# Patient Record
Sex: Male | Born: 1990 | Race: Black or African American | Hispanic: No | Marital: Married | Smoking: Never smoker
Health system: Southern US, Community
[De-identification: ages and names within clinical notes are randomized; demographics above are authoritative.]

## PROBLEM LIST (undated history)

## (undated) DIAGNOSIS — T7840XA Allergy, unspecified, initial encounter: Secondary | ICD-10-CM

## (undated) DIAGNOSIS — J302 Other seasonal allergic rhinitis: Secondary | ICD-10-CM

## (undated) HISTORY — DX: Allergy, unspecified, initial encounter: T78.40XA

---
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
## 2013-04-04 ENCOUNTER — Emergency Department (HOSPITAL_COMMUNITY)
Admission: EM | Admit: 2013-04-04 | Discharge: 2013-04-04 | Disposition: A | Discharge status: Home / Self Care | Payer: BC Managed Care – PPO | Attending: Emergency Medicine | Admitting: Emergency Medicine

## 2013-04-04 ENCOUNTER — Encounter (HOSPITAL_COMMUNITY): Payer: Self-pay | Admitting: Emergency Medicine

## 2013-04-04 DIAGNOSIS — R109 Unspecified abdominal pain: Secondary | ICD-10-CM | POA: Insufficient documentation

## 2013-04-04 DIAGNOSIS — R52 Pain, unspecified: Secondary | ICD-10-CM | POA: Insufficient documentation

## 2013-04-04 DIAGNOSIS — IMO0001 Reserved for inherently not codable concepts without codable children: Secondary | ICD-10-CM | POA: Insufficient documentation

## 2013-04-04 DIAGNOSIS — R112 Nausea with vomiting, unspecified: Secondary | ICD-10-CM | POA: Insufficient documentation

## 2013-04-04 DIAGNOSIS — R51 Headache: Secondary | ICD-10-CM | POA: Insufficient documentation

## 2013-04-04 DIAGNOSIS — R509 Fever, unspecified: Secondary | ICD-10-CM | POA: Insufficient documentation

## 2013-04-04 DIAGNOSIS — B349 Viral infection, unspecified: Secondary | ICD-10-CM

## 2013-04-04 DIAGNOSIS — K2970 Gastritis, unspecified, without bleeding: Secondary | ICD-10-CM | POA: Insufficient documentation

## 2013-04-04 DIAGNOSIS — K2990 Gastroduodenitis, unspecified, without bleeding: Secondary | ICD-10-CM | POA: Insufficient documentation

## 2013-04-04 DIAGNOSIS — B9789 Other viral agents as the cause of diseases classified elsewhere: Secondary | ICD-10-CM | POA: Insufficient documentation

## 2013-04-04 LAB — CBC WITH DIFFERENTIAL/PLATELET
Basophils Absolute: 0 10*3/uL (ref 0.0–0.1)
Basophils Relative: 0 % (ref 0–1)
Eosinophils Absolute: 0 10*3/uL (ref 0.0–0.7)
Eosinophils Relative: 0 % (ref 0–5)
HCT: 43.2 % (ref 39.0–52.0)
Hemoglobin: 16 g/dL (ref 13.0–17.0)
Lymphocytes Relative: 6 % — ABNORMAL LOW (ref 12–46)
Lymphs Abs: 0.4 10*3/uL — ABNORMAL LOW (ref 0.7–4.0)
MCH: 31.5 pg (ref 26.0–34.0)
MCHC: 37 g/dL — ABNORMAL HIGH (ref 30.0–36.0)
MCV: 85 fL (ref 78.0–100.0)
Monocytes Absolute: 0.6 10*3/uL (ref 0.1–1.0)
Monocytes Relative: 9 % (ref 3–12)
Neutro Abs: 5.7 10*3/uL (ref 1.7–7.7)
Neutrophils Relative %: 85 % — ABNORMAL HIGH (ref 43–77)
Platelets: 217 10*3/uL (ref 150–400)
RBC: 5.08 MIL/uL (ref 4.22–5.81)
RDW: 12.2 % (ref 11.5–15.5)
WBC: 6.8 10*3/uL (ref 4.0–10.5)

## 2013-04-04 LAB — COMPREHENSIVE METABOLIC PANEL
ALT: 28 U/L (ref 0–53)
Albumin: 3.8 g/dL (ref 3.5–5.2)
Calcium: 9.3 mg/dL (ref 8.4–10.5)
GFR calc Af Amer: >90 mL/min (ref >90)
Glucose, Bld: 108 mg/dL — ABNORMAL HIGH (ref 70–99)
Sodium: 137 mEq/L (ref 135–145)
Total Protein: 7.5 g/dL (ref 6.0–8.3)

## 2013-04-04 MED ORDER — ONDANSETRON HCL 4 MG/2ML IJ SOLN
4.0000 mg | Freq: Once | INTRAMUSCULAR | Status: AC
  Administered 2013-04-04: 4 mg via INTRAVENOUS
  Filled 2013-04-04: qty 2

## 2013-04-04 MED ORDER — SODIUM CHLORIDE 0.9 % IV BOLUS (SEPSIS)
500.0000 mL | Freq: Once | INTRAVENOUS | Status: AC
  Administered 2013-04-04: 500 mL via INTRAVENOUS

## 2013-04-04 MED ORDER — PROMETHAZINE HCL 25 MG PO TABS: 25.0000 mg | ORAL_TABLET | Freq: Three times a day (TID) | ORAL | Status: DC | PRN

## 2013-04-04 NOTE — ED Notes (Addendum)
Patient states he has been N/V and fevers since yesterday. Patient has not taken any OTC's. Patient ambulated to the room without difficulty but stated he has been dizzy. He states he thinks he is dehydrated. Patient states his whole body aches,"feels like its muscle and joint pain".

## 2013-04-04 NOTE — ED Provider Notes (Signed)
History     CSN: [id number]  Arrival date & time 04/04/13  1125   First MD Initiated Contact with Patient 04/04/13 1212      Chief Complaint  Patient presents with  . Emesis    (Consider location/radiation/quality/duration/timing/severity/associated sxs/prior treatment) HPI Comments: Joel Bradley is a 22 y/o M presenting to the ED with abdominal pain, emesis, nausea, and generalized bodyaches x 1 day. Patient reported that the symptoms started yesterday after Church. Patient stated that he vomited 3 times - stated that it was mainly of food contents first that gradually went to liquid contents - denied blood, mucus, biliary substances. Stated that he has not had an episode of emesis today. Patient reported that he has been having intermittent abdominal pain since yesterday - described discomfort as a burning sensation that occurs only when he feels hungry and last only a short couple of minutes. Patient reported that he is having generalized bodyaches - described as an aching sensation to both his "muscles and bones" stated that he has been having some mild muscle cramping in his calves that last for a couple of seconds. Patient reported that he took Tylenol cold and flu yesterday one time - stated that it did not help. Patient reported that he was fine Saturday. Associated symptoms are subjective fevers and mild headache. Denied diarrhea, hematochezia, melena, chest pain, shortness of breathe, difficulty breathing, abdominal pain, neck stiffness, neck pain, ear complaints, visual distortions, eye complaints.  PCP: Dr. Tenny Craw    The history is provided by the patient. No language interpreter was used.    History reviewed. No pertinent past medical history.  History reviewed. No pertinent past surgical history.  No family history on file.  History  Substance Use Topics  . Smoking status: Never Smoker   . Smokeless tobacco: Not on file  . Alcohol Use: Yes      Review of Systems   Constitutional: Positive for fever. Negative for chills, diaphoresis and fatigue.  HENT: Negative for ear pain, congestion, sore throat, trouble swallowing, neck pain, neck stiffness and tinnitus.   Eyes: Negative for photophobia, pain and visual disturbance.  Respiratory: Negative for cough, chest tightness, shortness of breath and wheezing.   Cardiovascular: Negative for chest pain.  Gastrointestinal: Positive for nausea, vomiting and abdominal pain. Negative for diarrhea, constipation and blood in stool.  Genitourinary: Negative for dysuria, urgency, frequency, hematuria, decreased urine volume, penile swelling, difficulty urinating, penile pain and testicular pain.  Musculoskeletal: Positive for myalgias and arthralgias.  Skin: Negative for rash and wound.  Neurological: Positive for headaches. Negative for dizziness, weakness, light-headedness and numbness.  All other systems reviewed and are negative.    Allergies  Review of patient's allergies indicates no known allergies.  Home Medications   Current Outpatient Rx  Name  Route  Sig  Dispense  Refill  . Phenylephrine-DM-GG-APAP (TYLENOL COLD/FLU SEVERE PO)   Oral   Take 1 tablet by mouth every 6 (six) hours as needed (for cold symptoms).         . promethazine (PHENERGAN) 25 MG tablet   Oral   Take 1 tablet (25 mg total) by mouth every 8 (eight) hours as needed for nausea.   12 tablet   0     BP 119/70  Pulse 71  Temp(Src) 98.8 F (37.1 C) (Oral)  Resp 21  SpO2 98%  Physical Exam  Nursing note and vitals reviewed. Constitutional: He is oriented to person, place, and time. He appears well-developed and well-nourished.  No distress.  HENT:  Head: Normocephalic and atraumatic.  Mouth/Throat: Oropharynx is clear and moist and mucous membranes are normal. No oropharyngeal exudate, posterior oropharyngeal edema, posterior oropharyngeal erythema or tonsillar abscesses.  Uvula midline, symmetrical elevation  Eyes:  Conjunctivae and EOM are normal. Pupils are equal, round, and reactive to light. Right eye exhibits no discharge. Left eye exhibits no discharge.  Neck: Normal range of motion. Neck supple. No tracheal deviation present.  Negative nuchal rigidity Negative neck stiffness Negative lymphadenopathy Negative tenderness along cervical spine - negative spinous process tenderness.   Cardiovascular: Normal rate, regular rhythm and normal heart sounds.  Exam reveals no friction rub.   No murmur heard. Radial pulses 2+ bilaterally Pedal pulses 2+ bilaterally  Pulmonary/Chest: Effort normal and breath sounds normal. No respiratory distress. He has no wheezes. He has no rales.  Abdominal: Soft. Bowel sounds are normal. He exhibits no distension and no mass. There is no tenderness. There is no rebound and no guarding.  Musculoskeletal: Normal range of motion. He exhibits no edema and no tenderness.  Full ROM to upper and lower extremities Strength 5+/5+ to upper and lower extremities  Negative swelling, erythema, effusion, warmth to touch, inflammation to upper and lower joints - negative sign of infection  Lymphadenopathy:    He has no cervical adenopathy.  Neurological: He is alert and oriented to person, place, and time. No cranial nerve deficit. He exhibits normal muscle tone. Coordination normal.  Cranial nerves III-XII grossly intact   Skin: Skin is warm and dry. No rash noted. He is not diaphoretic. No erythema.  Psychiatric: He has a normal mood and affect. His behavior is normal. Thought content normal.    ED Course  Procedures (including critical care time)  IV NS fluids with Zofran IV given  2:34PM Patient re-evaluated, feeling better.   Labs Reviewed  CBC WITH DIFFERENTIAL - Abnormal; Notable for the following:    MCHC 37.0 (*)    Neutrophils Relative % 85 (*)    Lymphocytes Relative 6 (*)    Lymphs Abs 0.4 (*)    All other components within normal limits  COMPREHENSIVE METABOLIC  PANEL - Abnormal; Notable for the following:    Glucose, Bld 108 (*)    Total Bilirubin 2.7 (*)    All other components within normal limits  LIPASE, BLOOD   No results found.   1. Gastritis   2. Viral illness       MDM  Patient afebrile, non-tachycardic, non-tachypneic, normotensive, adequate saturation on room air.  Negative fever, chills, neck stiffness, nuchal rigidity, neurological deficit, tenderness along cervical spine - less likely meningitis.  CBC and CMP negative findings - negative elevation of WBC Lipase negative - ruling out possible pancreatitis Discussed case with Dr. Denton Lank - suspicious of viral infection. Patient aseptic, non-toxic appearing, in no acute distress. Patient feeling better after fluids and antiemetics. Discharged patient with suspected viral infection and gastritis. Discharge patient with small dose of anti-emetics - discussed course. Referred patient to be followed up with PCP by the end of this week. Discussed with patient to stay hydrated, rest, Ibuprofen as when needed. Discussed viral symptoms and course. Discussed with patient to continue to monitor symptoms and if symptoms are to worsen or change to report back to the ED. Patient agreed to plan of care, understood, all questions answered.           Raymon Mutton, PA-C 04/04/13 2116

## 2013-04-04 NOTE — ED Notes (Signed)
States has been vomiting x 3 days had a fever yesterday and body aching no diarrhea

## 2013-04-05 NOTE — ED Provider Notes (Signed)
Medical screening examination/treatment/procedure(s) were conducted as a shared visit with non-physician practitioner(s) and myself.  I personally evaluated the patient during the encounter Pt with nv in past 1-2 days. Nausea now improved w med/ivf. abd soft nt. No fever.   Suzi Roots, MD 04/05/13 [phone]

## 2014-02-17 ENCOUNTER — Emergency Department (HOSPITAL_COMMUNITY)
Admission: EM | Admit: 2014-02-17 | Discharge: 2014-02-17 | Disposition: A | Discharge status: Home / Self Care | Payer: PRIVATE HEALTH INSURANCE | Attending: Emergency Medicine | Admitting: Emergency Medicine

## 2014-02-17 ENCOUNTER — Encounter (HOSPITAL_COMMUNITY): Payer: Self-pay | Admitting: Emergency Medicine

## 2014-02-17 DIAGNOSIS — J302 Other seasonal allergic rhinitis: Secondary | ICD-10-CM

## 2014-02-17 DIAGNOSIS — J309 Allergic rhinitis, unspecified: Secondary | ICD-10-CM | POA: Insufficient documentation

## 2014-02-17 HISTORY — DX: Other seasonal allergic rhinitis: J30.2

## 2014-02-17 MED ORDER — CETIRIZINE HCL 10 MG PO TABS: 10.0000 mg | ORAL_TABLET | Freq: Every day | ORAL | Status: DC

## 2014-02-17 MED ORDER — OXYMETAZOLINE HCL 0.05 % NA SOLN
1.0000 | Freq: Once | NASAL | Status: AC
  Administered 2014-02-17: 1 via NASAL
  Filled 2014-02-17: qty 15

## 2014-02-17 MED ORDER — FLUTICASONE PROPIONATE 50 MCG/ACT NA SUSP: 1.0000 | Freq: Every day | NASAL | Status: DC

## 2014-02-17 NOTE — ED Provider Notes (Signed)
CSN: [id number]     Arrival date & time 02/17/14  0407 History   First MD Initiated Contact with Patient 02/17/14 [phone]     Chief Complaint  Patient presents with  . Allergies     (Consider location/radiation/quality/duration/timing/severity/associated sxs/prior Treatment) HPI Comments: 23 year old male with history of seasonal allergies who presents with one day of runny nose, significant nasal congestion, itchy eyes and chest congestion. He states this happens every year in the spring, nothing makes it better or worse, took one dose of Benadryl earlier in the evening with minimal improvement. He denies fevers, stiff neck, chest pain or shortness of breath  The history is provided by the patient.    Past Medical History  Diagnosis Date  . Seasonal allergies    History reviewed. No pertinent past surgical history. No family history on file. History  Substance Use Topics  . Smoking status: Never Smoker   . Smokeless tobacco: Not on file  . Alcohol Use: Yes    Review of Systems  All other systems reviewed and are negative.      Allergies  Review of patient's allergies indicates no known allergies.  Home Medications   Current Outpatient Rx  Name  Route  Sig  Dispense  Refill  . cetirizine (ZYRTEC ALLERGY) 10 MG tablet   Oral   Take 1 tablet (10 mg total) by mouth daily.   30 tablet   1   . fluticasone (FLONASE) 50 MCG/ACT nasal spray   Each Nare   Place 1 spray into both nostrils daily.   9.9 g   2     Dispense small bottle - 9.889ml    BP 126/72  Pulse 56  Temp(Src) 97.4 F (36.3 C) (Oral)  Resp 19  SpO2 97% Physical Exam  Nursing note and vitals reviewed. Constitutional: He appears well-developed and well-nourished. No distress.  HENT:  Head: Normocephalic and atraumatic.  Mouth/Throat: Oropharynx is clear and moist. No oropharyngeal exudate.  Significant bilateral turbinate swelling in the naris, voluminous amounts of clear discharge, tympanic  membranes clear bilaterally  Eyes: Conjunctivae and EOM are normal. Pupils are equal, round, and reactive to light. Right eye exhibits no discharge. Left eye exhibits no discharge. No scleral icterus.  Neck: Normal range of motion. Neck supple. No JVD present. No thyromegaly present.  Cardiovascular: Normal rate, regular rhythm, normal heart sounds and intact distal pulses.  Exam reveals no gallop and no friction rub.   No murmur heard. Pulmonary/Chest: Effort normal and breath sounds normal. No respiratory distress. He has no wheezes. He has no rales.  Lymphadenopathy:    He has no cervical adenopathy.  Neurological: He is alert. Coordination normal.  Skin: Skin is warm and dry. No rash noted. No erythema.  Psychiatric: He has a normal mood and affect. His behavior is normal.    ED Course  Procedures (including critical care time) Labs Review Labs Reviewed - No data to display Imaging Review No results found.   EKG Interpretation None      MDM   Final diagnoses:  Seasonal allergies    Well-appearing, signs and symptoms consistent with seasonal allergies, stable for discharge with the following medications   Meds given in ED:  Medications  oxymetazoline (AFRIN) 0.05 % nasal spray 1 spray (not administered)    New Prescriptions   CETIRIZINE (ZYRTEC ALLERGY) 10 MG TABLET    Take 1 tablet (10 mg total) by mouth daily.   FLUTICASONE (FLONASE) 50 MCG/ACT NASAL SPRAY    Place  1 spray into both nostrils daily.        Vida Roller, MD 02/17/14 [phone]

## 2014-02-17 NOTE — Discharge Instructions (Signed)
Please call your doctor for a followup appointment within 24-48 hours. When you talk to your doctor please let them know that you were seen in the emergency department and have them acquire all of your records so that they can discuss the findings with you and formulate a treatment plan to fully care for your new and ongoing problems. ° °

## 2014-02-17 NOTE — ED Notes (Signed)
Patient expressing sinus congestion, sneezing, eyes itching, and sinus pressure. Requests medication for allergies.

## 2014-02-17 NOTE — ED Notes (Signed)
Pt. reports runny nose , nasal congestion , coryza with occasional productive cough onset last week , pt. stated history seasonal allergies worse this year .

## 2014-03-02 ENCOUNTER — Emergency Department (HOSPITAL_COMMUNITY)
Admission: EM | Admit: 2014-03-02 | Discharge: 2014-03-02 | Disposition: A | Discharge status: Home / Self Care | Payer: PRIVATE HEALTH INSURANCE | Attending: Emergency Medicine | Admitting: Emergency Medicine

## 2014-03-02 ENCOUNTER — Encounter (HOSPITAL_COMMUNITY): Payer: Self-pay | Admitting: Emergency Medicine

## 2014-03-02 DIAGNOSIS — J309 Allergic rhinitis, unspecified: Secondary | ICD-10-CM | POA: Insufficient documentation

## 2014-03-02 DIAGNOSIS — R062 Wheezing: Secondary | ICD-10-CM | POA: Insufficient documentation

## 2014-03-02 DIAGNOSIS — H579 Unspecified disorder of eye and adnexa: Secondary | ICD-10-CM | POA: Insufficient documentation

## 2014-03-02 DIAGNOSIS — J302 Other seasonal allergic rhinitis: Secondary | ICD-10-CM

## 2014-03-02 DIAGNOSIS — J3489 Other specified disorders of nose and nasal sinuses: Secondary | ICD-10-CM | POA: Insufficient documentation

## 2014-03-02 MED ORDER — ALBUTEROL SULFATE HFA 108 (90 BASE) MCG/ACT IN AERS: 2.0000 | INHALATION_SPRAY | RESPIRATORY_TRACT | Status: DC | PRN

## 2014-03-02 MED ORDER — PREDNISONE 20 MG PO TABS
60.0000 mg | ORAL_TABLET | Freq: Once | ORAL | Status: AC
  Administered 2014-03-02: 60 mg via ORAL
  Filled 2014-03-02: qty 3

## 2014-03-02 MED ORDER — ALBUTEROL SULFATE (2.5 MG/3ML) 0.083% IN NEBU
5.0000 mg | INHALATION_SOLUTION | Freq: Once | RESPIRATORY_TRACT | Status: AC
  Administered 2014-03-02: 5 mg via RESPIRATORY_TRACT
  Filled 2014-03-02: qty 6

## 2014-03-02 MED ORDER — IPRATROPIUM BROMIDE 0.02 % IN SOLN
0.5000 mg | Freq: Once | RESPIRATORY_TRACT | Status: AC
  Administered 2014-03-02: 0.5 mg via RESPIRATORY_TRACT
  Filled 2014-03-02: qty 2.5

## 2014-03-02 MED ORDER — PREDNISONE 10 MG PO TABS: ORAL_TABLET | ORAL | Status: DC

## 2014-03-02 NOTE — Discharge Instructions (Signed)
Stop taking any nasal spray which contains oxymetazoline. Continue zyrtec and nasacort. Take prednisone as prescribed until all gone with next dose due tomorrow. Take inhaler 2 puffs every 4 hrs as needed. Try saline nasal spray for congestion. Follow up with primary care doctor.    Allergies Allergies may happen from anything your body is sensitive to. This may be food, medicines, pollens, chemicals, and nearly anything around you in everyday life that produces allergens. An allergen is anything that causes an allergy producing substance. Heredity is often a factor in causing these problems. This means you may have some of the same allergies as your parents. Food allergies happen in all age groups. Food allergies are some of the most severe and life threatening. Some common food allergies are cow's milk, seafood, eggs, nuts, wheat, and soybeans. SYMPTOMS   Swelling around the mouth.  An itchy red rash or hives.  Vomiting or diarrhea.  Difficulty breathing. SEVERE ALLERGIC REACTIONS ARE LIFE-THREATENING. This reaction is called anaphylaxis. It can cause the mouth and throat to swell and cause difficulty with breathing and swallowing. In severe reactions only a trace amount of food (for example, peanut oil in a salad) may cause death within seconds. Seasonal allergies occur in all age groups. These are seasonal because they usually occur during the same season every year. They may be a reaction to molds, grass pollens, or tree pollens. Other causes of problems are house dust mite allergens, pet dander, and mold spores. The symptoms often consist of nasal congestion, a runny itchy nose associated with sneezing, and tearing itchy eyes. There is often an associated itching of the mouth and ears. The problems happen when you come in contact with pollens and other allergens. Allergens are the particles in the air that the body reacts to with an allergic reaction. This causes you to release allergic  antibodies. Through a chain of events, these eventually cause you to release histamine into the blood stream. Although it is meant to be protective to the body, it is this release that causes your discomfort. This is why you were given anti-histamines to feel better. If you are unable to pinpoint the offending allergen, it may be determined by skin or blood testing. Allergies cannot be cured but can be controlled with medicine. Hay fever is a collection of all or some of the seasonal allergy problems. It may often be treated with simple over-the-counter medicine such as diphenhydramine. Take medicine as directed. Do not drink alcohol or drive while taking this medicine. Check with your caregiver or package insert for child dosages. If these medicines are not effective, there are many new medicines your caregiver can prescribe. Stronger medicine such as nasal spray, eye drops, and corticosteroids may be used if the first things you try do not work well. Other treatments such as immunotherapy or desensitizing injections can be used if all else fails. Follow up with your caregiver if problems continue. These seasonal allergies are usually not life threatening. They are generally more of a nuisance that can often be handled using medicine. HOME CARE INSTRUCTIONS   If unsure what causes a reaction, keep a diary of foods eaten and symptoms that follow. Avoid foods that cause reactions.  If hives or rash are present:  Take medicine as directed.  You may use an over-the-counter antihistamine (diphenhydramine) for hives and itching as needed.  Apply cold compresses (cloths) to the skin or take baths in cool water. Avoid hot baths or showers. Heat will make a  rash and itching worse.  If you are severely allergic:  Following a treatment for a severe reaction, hospitalization is often required for closer follow-up.  Wear a medic-alert bracelet or necklace stating the allergy.  You and your family must  learn how to give adrenaline or use an anaphylaxis kit.  If you have had a severe reaction, always carry your anaphylaxis kit or EpiPen with you. Use this medicine as directed by your caregiver if a severe reaction is occurring. Failure to do so could have a fatal outcome. SEEK MEDICAL CARE IF:  You suspect a food allergy. Symptoms generally happen within 30 minutes of eating a food.  Your symptoms have not gone away within 2 days or are getting worse.  You develop new symptoms.  You want to retest yourself or your child with a food or drink you think causes an allergic reaction. Never do this if an anaphylactic reaction to that food or drink has happened before. Only do this under the care of a caregiver. SEEK IMMEDIATE MEDICAL CARE IF:   You have difficulty breathing, are wheezing, or have a tight feeling in your chest or throat.  You have a swollen mouth, or you have hives, swelling, or itching all over your body.  You have had a severe reaction that has responded to your anaphylaxis kit or an EpiPen. These reactions may return when the medicine has worn off. These reactions should be considered life threatening. MAKE SURE YOU:   Understand these instructions.  Will watch your condition.  Will get help right away if you are not doing well or get worse. Document Released: 01/27/2003 Document Revised: 02/28/2013 Document Reviewed: 07/03/2008 [facility] Patient Information 2014 [place]. Bronchospasm, Adult A bronchospasm is when the tubes that carry air in and out of your lungs (airwarys) spasm or tighten. During a bronchospasm it is hard to breathe. This is because the airways get smaller. A bronchospasm can be triggered by:  Allergies. These may be to animals, pollen, food, or mold.  Infection. This is a common cause of bronchospasm.  Exercise.  Irritants. These include pollution, cigarette smoke, strong odors, aerosol sprays, and paint fumes.  Weather  changes.  Stress.  Being emotional. HOME CARE   Always have a plan for getting help. Know when to call your doctor and local emergency services (911 in the [place] Know where you can get emergency care.  Only take medicines as told by your doctor.  If you were prescribed an inhaler or nebulizer machine, ask your doctor how to use it correctly. Always use a spacer with your inhaler if you were given one.  Stay calm during an attack. Try to relax and breathe more slowly.  Control your home environment:  Change your heating and air conditioning filter at least once a month.  Limit your use of fireplaces and wood stoves.  Do not  smoke. Do not  allow smoking in your home.  Avoid perfumes and fragrances.  Get rid of pests (such as roaches and mice) and their droppings.  Throw away plants if you see mold on them.  Keep your house clean and dust free.  Replace carpet with wood, tile, or vinyl flooring. Carpet can trap dander and dust.  Use allergy-proof pillows, mattress covers, and box spring covers.  Wash bed sheets and blankets every week in hot water. Dry them in a dryer.  Use blankets that are made of polyester or cotton.  Wash hands frequently. GET HELP IF:  You have  muscle aches.  You have chest pain.  The thick spit you spit or cough up (sputum) changes from clear or white to yellow, green, gray, or bloody.  The thick spit you spit or cough up gets thicker.  There are problems that may be related to the medicine you are given such as:  A rash.  Itching.  Swelling.  Trouble breathing. GET HELP RIGHT AWAY IF:  You feel you cannot breathe or catch your breath.  You cannot stop coughing.  Your treatment is not helping you breathe better. MAKE SURE YOU:   Understand these instructions.  Will watch your condition.  Will get help right away if you are not doing well or get worse. Document Released: 08/31/2009 Document Revised: 07/06/2013 Document  Reviewed: 04/26/2013 [facility] Patient Information 2014 [place].

## 2014-03-02 NOTE — ED Notes (Signed)
He states hes had congestion and allergies for the past few weeks. He was seen here recently and given allergy medication for the symptoms which did not help.

## 2014-03-02 NOTE — ED Provider Notes (Signed)
CSN: [id number]     Arrival date & time 03/02/14  1633 History   First MD Initiated Contact with Patient 03/02/14 1711    This chart was scribed for non-physician practitioner, Jaynie Crumbleatyana Tiffny Gemmer, PA, working with Raeford RazorStephen Kohut, MD by Marica OtterNusrat Rahman, ED Scribe. This patient was seen in room TR08C/TR08C and the patient's care was started at 5:32 PM.  PCP: Miguel AschoffOSS,ALLAN, MD Chief Complaint  Patient presents with  . Allergies   The history is provided by the patient. No language interpreter was used.   HPI Comments: [name] is a 23 y.o. male, with a history of seasonal allergies, who presents to the Emergency Department complaining of allergies with associated cough, rhinorrhea, itchy eyes, difficulty breathing, wheezing and congestion onset a few weeks ago. Pt reports his intermittent breathing difficulties began earlier this week. Pt reports he has been using flonase and zyrtec without relief.    Past Medical History  Diagnosis Date  . Seasonal allergies    History reviewed. No pertinent past surgical history. History reviewed. No pertinent family history. History  Substance Use Topics  . Smoking status: Never Smoker   . Smokeless tobacco: Not on file  . Alcohol Use: Yes    Review of Systems  Constitutional: Negative for fever and chills.  HENT: Positive for congestion and rhinorrhea.   Eyes: Positive for itching.  Respiratory: Positive for cough and wheezing.       Allergies  Review of patient's allergies indicates no known allergies.  Home Medications   Prior to Admission medications   Medication Sig Start Date End Date Taking? Authorizing Provider  cetirizine (ZYRTEC ALLERGY) 10 MG tablet Take 1 tablet (10 mg total) by mouth daily. 02/17/14   Vida RollerBrian D Miller, MD  fluticasone (FLONASE) 50 MCG/ACT nasal spray Place 1 spray into both nostrils daily. 02/17/14   Vida RollerBrian D Miller, MD   Triage Vitals: BP 132/66  Pulse 64  Temp(Src) 98.6 F (37 C) (Oral)  Resp 16  Ht 6\' 4"   (1.93 m)  Wt 227 lb 9.6 oz (103.239 kg)  BMI 27.72 kg/m2  SpO2 100% Physical Exam  Nursing note and vitals reviewed. Constitutional: He is oriented to person, place, and time. He appears well-developed and well-nourished. No distress.  HENT:  Head: Normocephalic and atraumatic.  Right Ear: External ear normal.  Left Ear: External ear normal.  Mouth/Throat: Oropharynx is clear and moist. No oropharyngeal exudate.  Eyes: EOM are normal.  Neck: Neck supple. No tracheal deviation present.  Cardiovascular: Normal rate.   Pulmonary/Chest: Effort normal. No respiratory distress. He has wheezes (expiratory ). He has no rales. He exhibits no tenderness.  Musculoskeletal: Normal range of motion.  Neurological: He is alert and oriented to person, place, and time.  Skin: Skin is warm and dry.  Psychiatric: He has a normal mood and affect. His behavior is normal.    ED Course  Procedures (including critical care time) DIAGNOSTIC STUDIES: Oxygen Saturation is 100% on RA, normal by my interpretation.    COORDINATION OF CARE:  5:34 PM-Discussed treatment plan which includes continuing flonase and zyrtec, breathing treatment, and prescription for inhaler with pt at bedside and pt agreed to plan.   Labs Review Labs Reviewed - No data to display  Imaging Review No results found.   EKG Interpretation None      MDM   Final diagnoses:  Wheezing  Seasonal allergies    Patient with seasonal allergies, already taking Zyrtec, Nasacort, presents with diffuse expiratory wheezes and shortness  of breath. His vital signs are normal. Have given him a breathing treatment at 60 mg for prednisone for his wheezing which has helped his symptoms. He does have history of reactive airway disease, with wheezing in the past. Suspect this might have been triggered by seasonal allergies. I have advised him to continue to take his allergy medications, and will give him a short five-day course of prednisone, and  an inhaler. Followup with primary care doctor.   Filed Vitals:   03/02/14 1700  BP: 132/66  Pulse: 64  Temp: 98.6 F (37 C)  TempSrc: Oral  Resp: 16  Height: 6\' 4"  (1.93 m)  Weight: 227 lb 9.6 oz (103.239 kg)  SpO2: 100%    I personally performed the services described in this documentation, which was scribed in my presence. The recorded information has been reviewed and is accurate.      Lottie Musselatyana A Lynnann Knudsen, PA-C 03/03/14 [phone]

## 2014-03-06 NOTE — ED Provider Notes (Signed)
Medical screening examination/treatment/procedure(s) were performed by non-physician practitioner and as supervising physician I was immediately available for consultation/collaboration.   EKG Interpretation None       Suanne Minahan, MD 03/06/14 0919 

## 2014-09-04 ENCOUNTER — Emergency Department (HOSPITAL_COMMUNITY)
Admission: EM | Admit: 2014-09-04 | Discharge: 2014-09-04 | Disposition: A | Discharge status: Home / Self Care | Payer: PRIVATE HEALTH INSURANCE | Attending: Emergency Medicine | Admitting: Emergency Medicine

## 2014-09-04 ENCOUNTER — Encounter (HOSPITAL_COMMUNITY): Payer: Self-pay | Admitting: Emergency Medicine

## 2014-09-04 DIAGNOSIS — R51 Headache: Secondary | ICD-10-CM | POA: Insufficient documentation

## 2014-09-04 DIAGNOSIS — Y9389 Activity, other specified: Secondary | ICD-10-CM | POA: Diagnosis not present

## 2014-09-04 DIAGNOSIS — Z7951 Long term (current) use of inhaled steroids: Secondary | ICD-10-CM | POA: Diagnosis not present

## 2014-09-04 DIAGNOSIS — Z043 Encounter for examination and observation following other accident: Secondary | ICD-10-CM | POA: Insufficient documentation

## 2014-09-04 DIAGNOSIS — Y92410 Unspecified street and highway as the place of occurrence of the external cause: Secondary | ICD-10-CM | POA: Diagnosis not present

## 2014-09-04 DIAGNOSIS — H6121 Impacted cerumen, right ear: Secondary | ICD-10-CM | POA: Insufficient documentation

## 2014-09-04 DIAGNOSIS — H6122 Impacted cerumen, left ear: Secondary | ICD-10-CM | POA: Insufficient documentation

## 2014-09-04 DIAGNOSIS — Z8709 Personal history of other diseases of the respiratory system: Secondary | ICD-10-CM | POA: Diagnosis not present

## 2014-09-04 DIAGNOSIS — M2550 Pain in unspecified joint: Secondary | ICD-10-CM | POA: Diagnosis not present

## 2014-09-04 DIAGNOSIS — Z79899 Other long term (current) drug therapy: Secondary | ICD-10-CM | POA: Diagnosis not present

## 2014-09-04 MED ORDER — METHOCARBAMOL 500 MG PO TABS: 500.0000 mg | ORAL_TABLET | Freq: Two times a day (BID) | ORAL | Status: DC

## 2014-09-04 MED ORDER — IBUPROFEN 800 MG PO TABS: 800.0000 mg | ORAL_TABLET | Freq: Three times a day (TID) | ORAL | Status: DC
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
## 2014-09-04 NOTE — Discharge Instructions (Signed)

## 2014-09-05 NOTE — ED Provider Notes (Signed)
Medical screening examination/treatment/procedure(s) were performed by non-physician practitioner and as supervising physician I was immediately available for consultation/collaboration.   EKG Interpretation None        Samuel JesterKathleen Eilah Common, DO 09/05/14 2315

## 2015-06-04 ENCOUNTER — Encounter (HOSPITAL_COMMUNITY): Payer: Self-pay | Admitting: Emergency Medicine

## 2015-06-04 ENCOUNTER — Emergency Department (HOSPITAL_COMMUNITY)
Admission: EM | Admit: 2015-06-04 | Discharge: 2015-06-04 | Disposition: A | Discharge status: Home / Self Care | Payer: PRIVATE HEALTH INSURANCE | Attending: Emergency Medicine | Admitting: Emergency Medicine

## 2015-06-04 DIAGNOSIS — X58XXXA Exposure to other specified factors, initial encounter: Secondary | ICD-10-CM | POA: Insufficient documentation

## 2015-06-04 DIAGNOSIS — M545 Low back pain, unspecified: Secondary | ICD-10-CM

## 2015-06-04 DIAGNOSIS — Y9367 Activity, basketball: Secondary | ICD-10-CM | POA: Insufficient documentation

## 2015-06-04 DIAGNOSIS — M62830 Muscle spasm of back: Secondary | ICD-10-CM | POA: Insufficient documentation

## 2015-06-04 DIAGNOSIS — Z8709 Personal history of other diseases of the respiratory system: Secondary | ICD-10-CM | POA: Insufficient documentation

## 2015-06-04 DIAGNOSIS — S3992XA Unspecified injury of lower back, initial encounter: Secondary | ICD-10-CM | POA: Insufficient documentation

## 2015-06-04 DIAGNOSIS — Y929 Unspecified place or not applicable: Secondary | ICD-10-CM | POA: Insufficient documentation

## 2015-06-04 DIAGNOSIS — Z79899 Other long term (current) drug therapy: Secondary | ICD-10-CM | POA: Insufficient documentation

## 2015-06-04 DIAGNOSIS — Z791 Long term (current) use of non-steroidal anti-inflammatories (NSAID): Secondary | ICD-10-CM | POA: Insufficient documentation

## 2015-06-04 DIAGNOSIS — Y999 Unspecified external cause status: Secondary | ICD-10-CM | POA: Insufficient documentation

## 2015-06-04 DIAGNOSIS — R001 Bradycardia, unspecified: Secondary | ICD-10-CM | POA: Insufficient documentation

## 2015-06-04 MED ORDER — NAPROXEN 500 MG PO TABS: 500.0000 mg | ORAL_TABLET | Freq: Two times a day (BID) | ORAL | Status: DC | PRN

## 2015-06-04 MED ORDER — CYCLOBENZAPRINE HCL 10 MG PO TABS: 10.0000 mg | ORAL_TABLET | Freq: Three times a day (TID) | ORAL | Status: DC | PRN

## 2015-06-04 MED ORDER — NAPROXEN 500 MG PO TABS
500.0000 mg | ORAL_TABLET | Freq: Once | ORAL | Status: AC
Start: 2015-06-04 — End: 2015-06-04
  Administered 2015-06-04: 500 mg via ORAL
  Filled 2015-06-04: qty 1

## 2015-06-04 MED ORDER — CYCLOBENZAPRINE HCL 10 MG PO TABS
10.0000 mg | ORAL_TABLET | Freq: Once | ORAL | Status: AC
  Administered 2015-06-04: 10 mg via ORAL
  Filled 2015-06-04: qty 1

## 2015-06-04 NOTE — ED Notes (Signed)
Pt c/o lower back pain that started on Saturday when playing basketball. Pt state the pain is constant.

## 2015-06-04 NOTE — Discharge Instructions (Signed)
Back Pain:  Your back pain should be treated with medicines such as ibuprofen or aleve and this back pain should get better over the next 2 weeks.  However if you develop severe or worsening pain, low back pain with fever, numbness, weakness or inability to walk or urinate, you should return to the ER immediately.  Please follow up with your doctor this week for a recheck if still having symptoms. Avoid heavy lifting over 10 pounds for the next two weeks.  Low back pain is discomfort in the lower back that may be due to injuries to muscles and ligaments around the spine.  Occasionally, it may be caused by a a problem to a part of the spine called a disc.  The pain may last several days or a week;  However, most patients get completely well in 4 weeks.  Self - care:  The application of heat can help soothe the pain.  Maintaining your daily activities, including walking, is encourged, as it will help you get better faster than just staying in bed. Perform gentle stretching as discussed. Drink plenty of fluids.  Medications are also useful to help with pain control.  A commonly prescribed medication includes tylenol.  Non steroidal anti inflammatory medications including Ibuprofen and naproxen;  These medications help both pain and swelling and are very useful in treating back pain.  They should be taken with food, as they can cause stomach upset, and more seriously, stomach bleeding.    Muscle relaxants:  These medications can help with muscle tightness that is a cause of lower back pain.  Most of these medications can cause drowsiness, and it is not safe to drive or use dangerous machinery while taking them.  SEEK IMMEDIATE MEDICAL ATTENTION IF: New numbness, tingling, weakness, or problem with the use of your arms or legs.  Severe back pain not relieved with medications.  Difficulty with or loss of control of your bowel or bladder control.  Increasing pain in any areas of the body (such as chest or  abdominal pain).  Shortness of breath, dizziness or fainting.  Nausea (feeling sick to your stomach), vomiting, fever, or sweats.  You will need to follow up with  Your primary healthcare provider in 1-2 weeks for reassessment.   Back Pain, Adult Back pain is very common. The pain often gets better over time. The cause of back pain is usually not dangerous. Most people can learn to manage their back pain on their own.  HOME CARE   Stay active. Start with short walks on flat ground if you can. Try to walk farther each day.  Do not sit, drive, or stand in one place for more than 30 minutes. Do not stay in bed.  Do not avoid exercise or work. Activity can help your back heal faster.  Be careful when you bend or lift an object. Bend at your knees, keep the object close to you, and do not twist.  Sleep on a firm mattress. Lie on your side, and bend your knees. If you lie on your back, put a pillow under your knees.  Only take medicines as told by your doctor.  Put ice on the injured area.  Put ice in a plastic bag.  Place a towel between your skin and the bag.  Leave the ice on for 15-20 minutes, 03-04 times a day for the first 2 to 3 days. After that, you can switch between ice and heat packs.  Ask your doctor about  back exercises or massage.  Avoid feeling anxious or stressed. Find good ways to deal with stress, such as exercise. GET HELP RIGHT AWAY IF:   Your pain does not go away with rest or medicine.  Your pain does not go away in 1 week.  You have new problems.  You do not feel well.  The pain spreads into your legs.  You cannot control when you poop (bowel movement) or pee (urinate).  Your arms or legs feel weak or lose feeling (numbness).  You feel sick to your stomach (nauseous) or throw up (vomit).  You have belly (abdominal) pain.  You feel like you may pass out (faint). MAKE SURE YOU:   Understand these instructions.  Will watch your  condition.  Will get help right away if you are not doing well or get worse. Document Released: 04/21/2008 Document Revised: 01/26/2012 Document Reviewed: 03/07/2014 [facility] Patient Information 2015 [place], [place]. This information is not intended to replace advice given to you by your health care provider. Make sure you discuss any questions you have with your health care provider.  Back Injury Prevention The following tips can help you to prevent a back injury. PHYSICAL FITNESS  Exercise often. Try to develop strong stomach (abdominal) muscles.  Do aerobic exercises often. This includes walking, jogging, biking, swimming.  Do exercises that help with balance and strength often. This includes tai chi and yoga.  Stretch before and after you exercise.  Keep a healthy weight. DIET   Ask your doctor how much calcium and vitamin D you need every day.  Include calcium in your diet. Foods high in calcium include dairy products; green, leafy vegetables; and products with calcium added (fortified).  Include vitamin D in your diet. Foods high in vitamin D include milk and products with vitamin D added.  Think about taking a multivitamin or other nutritional products called " supplements."  Stop smoking if you smoke. POSTURE   Sit and stand up straight. Avoid leaning forward or hunching over.  Choose chairs that support your lower back.  If you work at a desk:  Sit close to your work so you do not lean over.  Keep your chin tucked in.  Keep your neck drawn back.  Keep your elbows bent at a right angle. Your arms should look like the letter "L."  Sit high and close to the steering wheel when you drive. Add low back support to your car seat if needed.  Avoid sitting or standing in one position for too long. Get up and move around every hour. Take breaks if you are driving for a long time.  Sleep on your side with your knees slightly bent. You can also sleep on your back with a  pillow under your knees. Do not sleep on your stomach. LIFTING, TWISTING, AND REACHING  Avoid heavy lifting, especially lifting over and over again. If you must do heavy lifting:  Stretch before lifting.  Work slowly.  Rest between lifts.  Use carts and dollies to move objects when possible.  Make several small trips instead of carrying 1 heavy load.  Ask for help when you need it.  Ask for help when moving big, awkward objects.  Follow these steps when lifting:  Stand with your feet shoulder-width apart.  Get as close to the object as you can. Do not pick up heavy objects that are far from your body.  Use handles or lifting straps when possible.  Bend at your knees. Squat down,  but keep your heels off the floor.  Keep your shoulders back, your chin tucked in, and your back straight.  Lift the object slowly. Tighten the muscles in your legs, stomach, and butt. Keep the object as close to the center of your body as possible.  Reverse these directions when you put a load down.  Do not:  Lift the object above your waist.  Twist at the waist while lifting or carrying a load. Move your feet if you need to turn, not your waist.  Bend over without bending at your knees.  Avoid reaching over your head, across a table, or for an object on a high surface. OTHER TIPS  Avoid wet floors and keep sidewalks clear of ice.  Do not sleep on a mattress that is too soft or too hard.  Keep items that you use often within easy reach.  Put heavier objects on shelves at waist level. Put lighter objects on lower or higher shelves.  Find ways to lessen your stress. You can try exercise, massage, or relaxation.  Get help for depression or anxiety if needed. GET HELP IF:  You injure your back.  You have questions about diet, exercise, or other ways to prevent back injuries. MAKE SURE YOU:  Understand these instructions.  Will watch your condition.  Will get help right away if  you are not doing well or get worse. Document Released: 04/21/2008 Document Revised: 01/26/2012 Document Reviewed: 12/15/2011 [facility] Patient Information 2015 [place], [place]. This information is not intended to replace advice given to you by your health care provider. Make sure you discuss any questions you have with your health care provider.  Back Exercises Back exercises help treat and prevent back injuries. The goal is to increase your strength in your belly (abdominal) and back muscles. These exercises can also help with flexibility. Start these exercises when told by your doctor. HOME CARE Back exercises include: Pelvic Tilt.  Lie on your back with your knees bent. Tilt your pelvis until the lower part of your back is against the floor. Hold this position 5 to 10 sec. Repeat this exercise 5 to 10 times. Knee to Chest.  Pull 1 knee up against your chest and hold for 20 to 30 seconds. Repeat this with the other knee. This may be done with the other leg straight or bent, whichever feels better. Then, pull both knees up against your chest. Sit-Ups or Curl-Ups.  Bend your knees 90 degrees. Start with tilting your pelvis, and do a partial, slow sit-up. Only lift your upper half 30 to 45 degrees off the floor. Take at least 2 to 3 seonds for each sit-up. Do not do sit-ups with your knees out straight. If partial sit-ups are difficult, simply do the above but with only tightening your belly (abdominal) muscles and holding it as told. Hip-Lift.  Lie on your back with your knees flexed 90 degrees. Push down with your feet and shoulders as you raise your hips 2 inches off the floor. Hold for 10 seconds, repeat 5 to 10 times. Back Arches.  Lie on your stomach. Prop yourself up on bent elbows. Slowly press on your hands, causing an arch in your low back. Repeat 3 to 5 times. Shoulder-Lifts.  Lie face down with arms beside your body. Keep hips and belly pressed to floor as you slowly lift your  head and shoulders off the floor. Do not overdo your exercises. Be careful in the beginning. Exercises may cause you some mild  back discomfort. If the pain lasts for more than 15 minutes, stop the exercises until you see your doctor. Improvement with exercise for back problems is slow.  Document Released: 12/06/2010 Document Revised: 01/26/2012 Document Reviewed: 09/04/2011 [facility] Patient Information 2015 [place], [place]. This information is not intended to replace advice given to you by your health care provider. Make sure you discuss any questions you have with your health care provider.

## 2015-06-04 NOTE — ED Provider Notes (Signed)
CSN: [id number]     Arrival date & time 06/04/15  1236 History   First MD Initiated Contact with Patient 06/04/15 1255     Chief Complaint  Patient presents with  . Back Pain     (Consider location/radiation/quality/duration/timing/severity/associated sxs/prior Treatment) HPI Comments: Joel Bradley is a 24 y.o. male with a PMHx of seasonal allergies, who presents to the ED with complaints of back pain that began 2 days ago when he went up for a layup while playing basketball and when he came down on both legs, he felt the back pain begin. He reports the pain is 8/10 constant low back soreness achy pain radiating down both thighs, worse with movement and bending, and unrelieved with NSAIDs and his sisters Norco. He denies any fevers, chills, chest pain, shortness breath, abdominal pain, nausea, vomiting, diarrhea, speech, melena, hematochezia, dysuria, hematuria, flank pain, numbness, tingling, weakness, cauda equina symptoms, headache, vision changes, history of cancer or IV drug use.  Patient is a 24 y.o. male presenting with back pain. The history is provided by the patient. No language interpreter was used.  Back Pain Location:  Lumbar spine Quality:  Aching Radiates to:  L thigh and R thigh Pain severity:  Moderate Pain is:  Same all the time Onset quality:  Gradual Duration:  2 days Timing:  Constant Progression:  Unchanged Chronicity:  New Context: recent injury   Context comment:  Playing basketball, came down on his legs and felt back pain Relieved by:  Nothing Worsened by:  Movement and bending Ineffective treatments:  NSAIDs and narcotics Associated symptoms: no abdominal pain, no bladder incontinence, no chest pain, no dysuria, no fever, no headaches, no numbness, no paresthesias, no perianal numbness, no tingling and no weakness   Risk factors: no hx of cancer     Past Medical History  Diagnosis Date  . Seasonal allergies    History reviewed. No pertinent past  surgical history. No family history on file. History  Substance Use Topics  . Smoking status: Never Smoker   . Smokeless tobacco: Not on file  . Alcohol Use: Yes     Comment: occasional     Review of Systems  Constitutional: Negative for fever and chills.  Eyes: Negative for visual disturbance.  Respiratory: Negative for shortness of breath.   Cardiovascular: Negative for chest pain.  Gastrointestinal: Negative for nausea, vomiting, abdominal pain, diarrhea, constipation and blood in stool.  Genitourinary: Negative for bladder incontinence, dysuria, hematuria and flank pain.  Musculoskeletal: Positive for back pain. Negative for myalgias and arthralgias.  Skin: Negative for color change.  Allergic/Immunologic: Negative for immunocompromised state.  Neurological: Negative for tingling, weakness, numbness, headaches and paresthesias.  Psychiatric/Behavioral: Negative for confusion.   10 Systems reviewed and are negative for acute change except as noted in the HPI.    Allergies  Review of patient's allergies indicates no known allergies.  Home Medications   Prior to Admission medications   Medication Sig Start Date End Date Taking? Authorizing Provider  albuterol (PROVENTIL HFA;VENTOLIN HFA) 108 (90 BASE) MCG/ACT inhaler Inhale 2 puffs into the lungs every 4 (four) hours as needed for wheezing or shortness of breath. 03/02/14   Tatyana Kirichenko, PA-C  cetirizine (ZYRTEC ALLERGY) 10 MG tablet Take 1 tablet (10 mg total) by mouth daily. 02/17/14   Eber HongBrian Miller, MD  ibuprofen (ADVIL,MOTRIN) 800 MG tablet Take 1 tablet (800 mg total) by mouth 3 (three) times daily. 09/04/14   Fayrene HelperBowie Tran, PA-C  methocarbamol (ROBAXIN) 500 MG  tablet Take 1 tablet (500 mg total) by mouth 2 (two) times daily. 09/04/14   Fayrene Helper, PA-C  predniSONE (DELTASONE) 10 MG tablet Take 5 tab day 1, take 4 tab day 2, take 3 tab day 3, take 2 tab day 4, and take 1 tab day 5 03/02/14   Tatyana Kirichenko, PA-C    triamcinolone (NASACORT ALLERGY 24HR) 55 MCG/ACT AERO nasal inhaler Place 2 sprays into the nose daily.    Historical Provider, MD   BP 123/68 mmHg  Pulse 57  Temp(Src) 98.1 F (36.7 C) (Oral)  Resp 16  Ht 6' 4.5" (1.943 m)  Wt 230 lb (104.327 kg)  BMI 27.63 kg/m2  SpO2 98% Physical Exam  Constitutional: He is oriented to person, place, and time. Vital signs are normal. He appears well-developed and well-nourished.  Non-toxic appearance. No distress.  Afebrile, nontoxic, NAD  HENT:  Head: Normocephalic and atraumatic.  Mouth/Throat: Oropharynx is clear and moist and mucous membranes are normal.  Eyes: Conjunctivae and EOM are normal. Right eye exhibits no discharge. Left eye exhibits no discharge.  Neck: Normal range of motion. Neck supple.  Cardiovascular: Regular rhythm, normal heart sounds and intact distal pulses.  Bradycardia present.  Exam reveals no gallop and no friction rub.   No murmur heard. HR 57 on triage vitals, reg rhythm, nl s1/s2, nl m/r/g. Distal pulses intact  Pulmonary/Chest: Effort normal and breath sounds normal. No respiratory distress. He has no decreased breath sounds. He has no wheezes. He has no rhonchi. He has no rales.  Abdominal: Soft. Normal appearance and bowel sounds are normal. He exhibits no distension. There is no tenderness. There is no rigidity, no rebound, no guarding and no CVA tenderness.  Musculoskeletal: Normal range of motion.       Lumbar back: He exhibits tenderness and spasm. He exhibits normal range of motion, no bony tenderness and no deformity.       Back:  Lumbar spine with FROM intact without spinous process TTP, no bony stepoffs or deformities, mild b/l paraspinous muscle TTP and palpable muscle spasms. Strength 5/5 in all extremities, sensation grossly intact in all extremities, negative SLR bilaterally, gait steady and nonantalgic. No overlying skin changes.   Neurological: He is alert and oriented to person, place, and time. He  has normal strength. No sensory deficit.  Skin: Skin is warm, dry and intact. No rash noted.  Psychiatric: He has a normal mood and affect.  Nursing note and vitals reviewed.   ED Course  Procedures (including critical care time) Labs Review Labs Reviewed - No data to display  Imaging Review No results found.   EKG Interpretation None      MDM   Final diagnoses:  Bilateral low back pain without sciatica  Muscle spasm of back    24 y.o. male here with low back pain since Saturday when he played basketball and came down on his legs. No red flag s/s of low back pain. No s/s of central cord compression or cauda equina. Lower extremities are neurovascularly intact and patient is ambulating without difficulty. Mild b/l paraspinous muscle tenderness and spasm, no midline tenderness. Doubt need for imaging.  Patient was counseled on back pain precautions and told to do activity as tolerated but do not lift, push, or pull heavy objects more than 10 pounds for the next week. Patient counseled to use ice or heat on back for no longer than 15 minutes every hour.   Rx given for muscle relaxer and counseled  on proper use of muscle relaxant medication. Rx for naprosyn given. Discussed use of tylenol as needed PRN pain. Urged patient not to drink alcohol, drive, or perform any other activities that requires focus while taking muscle relaxants.   Patient urged to follow-up with PCP if pain does not improve with treatment and rest or if pain becomes recurrent. Urged to return with worsening severe pain, loss of bowel or bladder control, trouble walking. The patient verbalizes understanding and agrees with the plan.   BP 123/68 mmHg  Pulse 57  Temp(Src) 98.1 F (36.7 C) (Oral)  Resp 16  Ht 6' 4.5" (1.943 m)  Wt 230 lb (104.327 kg)  BMI 27.63 kg/m2  SpO2 98%  Meds ordered this encounter  Medications  . cyclobenzaprine (FLEXERIL) tablet 10 mg    Sig:   . naproxen (NAPROSYN) tablet 500 mg     Sig:   . cyclobenzaprine (FLEXERIL) 10 MG tablet    Sig: Take 1 tablet (10 mg total) by mouth 3 (three) times daily as needed for muscle spasms.    Dispense:  15 tablet    Refill:  0    Order Specific Question:  Supervising Provider    Answer:  MILLER, BRIAN [[id number]]  . naproxen (NAPROSYN) 500 MG tablet    Sig: Take 1 tablet (500 mg total) by mouth 2 (two) times daily as needed for mild pain, moderate pain or headache (TAKE WITH MEALS.).    Dispense:  20 tablet    Refill:  0    Order Specific Question:  Supervising Provider    Answer:  Eber Hong [[id number]]     Kanden Carey Camprubi-Soms, PA-C 06/04/15 1333  Toy Cookey, MD 06/04/15 1556

## 2017-02-20 ENCOUNTER — Encounter (HOSPITAL_COMMUNITY): Payer: Self-pay

## 2017-02-20 DIAGNOSIS — J301 Allergic rhinitis due to pollen: Secondary | ICD-10-CM | POA: Insufficient documentation

## 2017-02-20 NOTE — ED Triage Notes (Signed)
Pt reports increased nasal/chest congestionseasonal and watery eyes r/t worsening seasonal allergies. Pt A+OX4, speaking in complete sentences, ambulatory to triage.

## 2017-02-21 ENCOUNTER — Emergency Department (HOSPITAL_COMMUNITY)
Admission: EM | Admit: 2017-02-21 | Discharge: 2017-02-21 | Disposition: A | Discharge status: Home / Self Care | Payer: PRIVATE HEALTH INSURANCE | Attending: Emergency Medicine | Admitting: Emergency Medicine

## 2017-02-21 DIAGNOSIS — J301 Allergic rhinitis due to pollen: Secondary | ICD-10-CM

## 2017-02-21 MED ORDER — PREDNISONE 10 MG (21) PO TBPK: ORAL_TABLET | Freq: Every day | ORAL | 0 refills | Status: DC

## 2017-02-21 NOTE — ED Provider Notes (Signed)
[facility]-EMERGENCY DEPT Provider Note   CSN: [id number] Arrival date & time: 02/20/17  2326  By signing my name below, I, Elder Negus, attest that this documentation has been prepared under the direction and in the presence of Lorre Nick, MD. Electronically Signed: Elder Negus, Scribe. 02/21/17. 1:30 AM.   History   Chief Complaint Chief Complaint  Patient presents with  . URI    HPI Joel Bradley is a 26 y.o. male with history of seasonal allergies who presents to the ED with upper respiratory complaints. He states that in the last several days he has experienced chest congestion, watering eyes, rhinorrhea, and sneezing. He states these are his typical symptoms when experiencing allergies. He is taking "liquid over the counter therapy" at home which is not helping.  The history is provided by the patient. No language interpreter was used.  URI   This is a recurrent problem. Episode onset: several days. The problem has not changed since onset.There has been no fever. Associated symptoms include congestion, rhinorrhea and sneezing. Treatments tried: over-the-counter therapy. The treatment provided no relief.    Past Medical History:  Diagnosis Date  . Seasonal allergies     There are no active problems to display for this patient.   History reviewed. No pertinent surgical history.     Home Medications    Prior to Admission medications   Medication Sig Start Date End Date Taking? Authorizing Provider  albuterol (PROVENTIL HFA;VENTOLIN HFA) 108 (90 BASE) MCG/ACT inhaler Inhale 2 puffs into the lungs every 4 (four) hours as needed for wheezing or shortness of breath. Patient not taking: Reported on 06/04/2015 03/02/14   Jaynie Crumble, PA-C  cetirizine (ZYRTEC ALLERGY) 10 MG tablet Take 1 tablet (10 mg total) by mouth daily. Patient not taking: Reported on 06/04/2015 02/17/14   Eber Hong, MD  cyclobenzaprine (FLEXERIL) 10 MG tablet Take 1 tablet (10 mg total) by  mouth 3 (three) times daily as needed for muscle spasms. 06/04/15   Mercedes Street, PA-C  ibuprofen (ADVIL,MOTRIN) 800 MG tablet Take 1 tablet (800 mg total) by mouth 3 (three) times daily. Patient not taking: Reported on 06/04/2015 09/04/14   Fayrene Helper, PA-C  methocarbamol (ROBAXIN) 500 MG tablet Take 1 tablet (500 mg total) by mouth 2 (two) times daily. 09/04/14   Fayrene Helper, PA-C  naproxen (NAPROSYN) 500 MG tablet Take 1 tablet (500 mg total) by mouth 2 (two) times daily as needed for mild pain, moderate pain or headache (TAKE WITH MEALS.). 06/04/15   Mercedes Street, PA-C  naproxen sodium (ANAPROX) 220 MG tablet Take 220 mg by mouth 2 (two) times daily with a meal.    Historical Provider, MD  OVER THE COUNTER MEDICATION Apply 1 application topically once. Pain Relief Cream    Historical Provider, MD  predniSONE (DELTASONE) 10 MG tablet Take 5 tab day 1, take 4 tab day 2, take 3 tab day 3, take 2 tab day 4, and take 1 tab day 5 Patient not taking: Reported on 06/04/2015 03/02/14   Jaynie Crumble, PA-C    Family History History reviewed. No pertinent family history.  Social History Social History  Substance Use Topics  . Smoking status: Never Smoker  . Smokeless tobacco: Never Used  . Alcohol use Yes     Comment: occasional      Allergies   Patient has no known allergies.   Review of Systems Review of Systems  Constitutional: Negative for fever.  HENT: Positive for congestion, rhinorrhea and sneezing.  All other systems reviewed and are negative.    Physical Exam Updated Vital Signs BP 129/86 (BP Location: Left Arm)   Pulse 66   Temp 97.7 F (36.5 C) (Oral)   Resp 18   Ht  (1.93 m)   Wt 251 lb (113.9 kg)   SpO2 99%   BMI 30.55 kg/m   Physical Exam  Constitutional: He is oriented to person, place, and time. He appears well-developed and well-nourished.  Non-toxic appearance. No distress.  HENT:  Head: Normocephalic and atraumatic.  No maxillary tenderness.    Eyes: Conjunctivae, EOM and lids are normal. Pupils are equal, round, and reactive to light.  Neck: Normal range of motion. Neck supple. No tracheal deviation present. No thyroid mass present.  Cardiovascular: Normal rate, regular rhythm and normal heart sounds.  Exam reveals no gallop.   No murmur heard. Pulmonary/Chest: Effort normal and breath sounds normal. No stridor. No respiratory distress. He has no decreased breath sounds. He has no wheezes. He has no rhonchi. He has no rales.  Abdominal: Soft. Normal appearance and bowel sounds are normal. He exhibits no distension. There is no tenderness. There is no rebound and no CVA tenderness.  Musculoskeletal: Normal range of motion. He exhibits no edema or tenderness.  Neurological: He is alert and oriented to person, place, and time. He has normal strength. No cranial nerve deficit or sensory deficit. GCS eye subscore is 4. GCS verbal subscore is 5. GCS motor subscore is 6.  Skin: Skin is warm and dry. No abrasion and no rash noted.  Psychiatric: He has a normal mood and affect. His speech is normal and behavior is normal.  Nursing note and vitals reviewed.    ED Treatments / Results  Labs (all labs ordered are listed, but only abnormal results are displayed) Labs Reviewed - No data to display  EKG  EKG Interpretation None       Radiology No results found.  Procedures Procedures (including critical care time)  Medications Ordered in ED Medications - No data to display   Initial Impression / Assessment and Plan / ED Course  I have reviewed the triage vital signs and the nursing notes.  Pertinent labs & imaging results that were available during my care of the patient were reviewed by me and considered in my medical decision making (see chart for details).     Patient to be treated with prednisone taper. And will give referral to allergist  Final Clinical Impressions(s) / ED Diagnoses   Final diagnoses:  None     New Prescriptions New Prescriptions   No medications on file  I personally performed the services described in this documentation, which was scribed in my presence. The recorded information has been reviewed and is accurate.      Lorre Nick, MD 02/21/17 [phone]

## 2017-11-25 ENCOUNTER — Encounter: Payer: Self-pay | Admitting: Physician Assistant

## 2017-11-25 ENCOUNTER — Other Ambulatory Visit: Payer: Self-pay

## 2017-11-25 ENCOUNTER — Ambulatory Visit: Payer: BLUE CROSS/BLUE SHIELD | Admitting: Physician Assistant

## 2017-11-25 VITALS — BP 132/80 | HR 66 | Temp 98.1°F | Resp 16 | Ht 73.5 in | Wt 268.6 lb

## 2017-11-25 DIAGNOSIS — N489 Disorder of penis, unspecified: Secondary | ICD-10-CM | POA: Diagnosis not present

## 2017-11-25 DIAGNOSIS — R509 Fever, unspecified: Secondary | ICD-10-CM

## 2017-11-25 DIAGNOSIS — R42 Dizziness and giddiness: Secondary | ICD-10-CM | POA: Diagnosis not present

## 2017-11-25 LAB — POCT URINALYSIS DIP (MANUAL ENTRY)
Bilirubin, UA: NEGATIVE
Glucose, UA: NEGATIVE mg/dL
Ketones, POC UA: NEGATIVE mg/dL
Leukocytes, UA: NEGATIVE
Nitrite, UA: NEGATIVE
Spec Grav, UA: >=1.03 — AB (ref 1.010–1.025)
Urobilinogen, UA: 4 E.U./dL — AB
pH, UA: 6.5 (ref 5.0–8.0)

## 2017-11-25 LAB — POCT CBC
Granulocyte percent: 66 % (ref 37–80)
HCT, POC: 47.4 % (ref 43.5–53.7)
Hemoglobin: 15.9 g/dL (ref 14.1–18.1)
Lymph, poc: 1.1 (ref 0.6–3.4)
MCH, POC: 29.9 pg (ref 27–31.2)
MCHC: 33.6 g/dL (ref 31.8–35.4)
MCV: 89 fL (ref 80–97)
MID (cbc): 0.4 (ref 0–0.9)
MPV: 7.1 fL (ref 0–99.8)
POC Granulocyte: 3 (ref 2–6.9)
POC LYMPH PERCENT: 24.8 %L (ref 10–50)
POC MID %: 9.2 %M (ref 0–12)
Platelet Count, POC: 207 10*3/uL (ref 142–424)
RBC: 5.33 M/uL (ref 4.69–6.13)
RDW, POC: 12.7 %
WBC: 4.6 10*3/uL (ref 4.6–10.2)

## 2017-11-25 LAB — POC INFLUENZA A&B (BINAX/QUICKVUE)
Influenza A, POC: NEGATIVE
Influenza B, POC: NEGATIVE

## 2017-11-25 MED ORDER — MUPIROCIN 2 % EX OINT: 1.0000 "application " | TOPICAL_OINTMENT | Freq: Three times a day (TID) | CUTANEOUS | 1 refills | Status: DC

## 2017-11-25 NOTE — Patient Instructions (Addendum)
  Continue to stay well hydrated. Drink 2-3 liters of water daily.  We will contact you with the results of your labs when they come back (3-7 days) Tylenol for fever.  Come back in 5-7 days if you are not better or if your symptoms worsen.   Thank you for coming in today. I hope you feel we met your needs.  Feel free to call PCP if you have any questions or further requests.  Please consider signing up for MyChart if you do not already have it, as this is a great way to communicate with me.  Best,  Whitney McVey, PA-C   IF you received an x-ray today, you will receive an invoice from [facility]. Please contact [facility] at [phone] with questions or concerns regarding your invoice.   IF you received labwork today, you will receive an invoice from [facility]. Please contact [facility] at [phone] with questions or concerns regarding your invoice.   Our billing staff will not be able to assist you with questions regarding bills from these companies.  You will be contacted with the lab results as soon as they are available. The fastest way to get your results is to activate your My Chart account. Instructions are located on the last page of this paperwork. If you have not heard from [place] regarding the results in 2 weeks, please contact this office.

## 2017-11-25 NOTE — Progress Notes (Signed)
Joel Bradley  MRN: [id number] DOB: Nov 09, 1991  PCP: Daisy Floro, MD  Subjective:  Pt is a 27 year old male who presents to clinic for fever, body aches and dizziness x 2 days. Symptoms started yesterday morning.   Endorses weakness and fatigue. Fever of 100.4. Taking tylenol.  Dizziness is improving after drinking orange juice. Endorses one episode of diarrhea yesterday.  No recent travel. No known sick contacts.  Denies runny, nose, cough, sneezing, sinus pressure, joint pain, urinary symptoms, discharge from penis, n/v.  He had a bump on the base of his penis with a hair growing out of it last week. He pulled the hair out and noted a white head on the bump. It is currently still healing.  He did not have flu shot this year.   Review of Systems  Constitutional: Positive for fatigue and fever. Negative for chills and diaphoresis.  HENT: Negative for congestion, postnasal drip, rhinorrhea, sinus pressure, sneezing and sore throat.   Respiratory: Negative for cough, shortness of breath and wheezing.   Gastrointestinal: Positive for diarrhea. Negative for abdominal pain, constipation, nausea and vomiting.  Genitourinary: Positive for genital sores. Negative for dysuria, enuresis, flank pain, frequency, penile pain, penile swelling, scrotal swelling, testicular pain and urgency.  Musculoskeletal: Positive for myalgias. Negative for arthralgias.  Skin: Negative for rash and wound.  Psychiatric/Behavioral: Negative for sleep disturbance.    There are no active problems to display for this patient.   Current Outpatient Medications on File Prior to Visit  Medication Sig Dispense Refill  . acetaminophen (TYLENOL) 500 MG tablet Take 500 mg by mouth every 6 (six) hours as needed.    Marland Kitchen albuterol (PROVENTIL HFA;VENTOLIN HFA) 108 (90 BASE) MCG/ACT inhaler Inhale 2 puffs into the lungs every 4 (four) hours as needed for wheezing or shortness of breath. (Patient not taking: Reported on  06/04/2015) 1 Inhaler 0  . cetirizine (ZYRTEC ALLERGY) 10 MG tablet Take 1 tablet (10 mg total) by mouth daily. (Patient not taking: Reported on 06/04/2015) 30 tablet 1  . cyclobenzaprine (FLEXERIL) 10 MG tablet Take 1 tablet (10 mg total) by mouth 3 (three) times daily as needed for muscle spasms. (Patient not taking: Reported on 11/25/2017) 15 tablet 0  . ibuprofen (ADVIL,MOTRIN) 800 MG tablet Take 1 tablet (800 mg total) by mouth 3 (three) times daily. (Patient not taking: Reported on 06/04/2015) 21 tablet 0  . methocarbamol (ROBAXIN) 500 MG tablet Take 1 tablet (500 mg total) by mouth 2 (two) times daily. (Patient not taking: Reported on 11/25/2017) 20 tablet 0  . naproxen (NAPROSYN) 500 MG tablet Take 1 tablet (500 mg total) by mouth 2 (two) times daily as needed for mild pain, moderate pain or headache (TAKE WITH MEALS.). (Patient not taking: Reported on 11/25/2017) 20 tablet 0  . naproxen sodium (ANAPROX) 220 MG tablet Take 220 mg by mouth 2 (two) times daily with a meal.    . OVER THE COUNTER MEDICATION Apply 1 application topically once. Pain Relief Cream     No current facility-administered medications on file prior to visit.     No Known Allergies   Objective:  BP 109/69   Pulse 66   Temp 98.1 F (36.7 C) (Oral)   Resp 16   Ht 6' 1.5" (1.867 m)   Wt 268 lb 9.6 oz (121.8 kg)   SpO2 96%   BMI 34.96 kg/m  Orthostatic VS for the past 24 hrs:  BP- Lying Pulse- Lying BP- Sitting Pulse- Sitting  BP- Standing at 0 minutes Pulse- Standing at 0 minutes  11/25/17 1731 120/72 61 125/82 72 110/77 83    Physical Exam  Constitutional: He is oriented to person, place, and time and well-developed, well-nourished, and in no distress. No distress.  HENT:  Right Ear: Tympanic membrane normal.  Left Ear: Tympanic membrane normal.  Nose: Mucosal edema present. No rhinorrhea. Right sinus exhibits no maxillary sinus tenderness and no frontal sinus tenderness. Left sinus exhibits no maxillary sinus  tenderness and no frontal sinus tenderness.  Mouth/Throat: Oropharynx is clear and moist and mucous membranes are normal.  Neck: Normal range of motion. Neck supple.  Cardiovascular: Normal rate, regular rhythm and normal heart sounds.  Pulmonary/Chest: Effort normal and breath sounds normal. No respiratory distress. He has no wheezes. He has no rales.  Abdominal: Soft. Bowel sounds are normal. There is no tenderness.  Genitourinary: Penis exhibits lesions (0.5cm disruption of epidermis. no drainage, vesicles, erythema ).  Lymphadenopathy:    He has no cervical adenopathy.  Neurological: He is alert and oriented to person, place, and time. GCS score is 15.  Skin: Skin is warm and dry.  Psychiatric: Mood, memory, affect and judgment normal.  Vitals reviewed.   Results for orders placed or performed in visit on 11/25/17  POC Influenza A&B(BINAX/QUICKVUE)  Result Value Ref Range   Influenza A, POC Negative Negative   Influenza B, POC Negative Negative  POCT urinalysis dipstick  Result Value Ref Range   Color, UA yellow yellow   Clarity, UA clear clear   Glucose, UA negative negative mg/dL   Bilirubin, UA negative negative   Ketones, POC UA negative negative mg/dL   Spec Grav, UA >=1.610>=1.030 (A) 1.010 - 1.025   Blood, UA trace-lysed (A) negative   pH, UA 6.5 5.0 - 8.0   Protein Ur, POC trace (A) negative mg/dL   Urobilinogen, UA 4.0 (A) 0.2 or 1.0 E.U./dL   Nitrite, UA Negative Negative   Leukocytes, UA Negative Negative  POCT CBC  Result Value Ref Range   WBC 4.6 4.6 - 10.2 K/uL   Lymph, poc 1.1 0.6 - 3.4   POC LYMPH PERCENT 24.8 10 - 50 %L   MID (cbc) 0.4 0 - 0.9   POC MID % 9.2 0 - 12 %M   POC Granulocyte 3.0 2 - 6.9   Granulocyte percent 66.0 37 - 80 %G   RBC 5.33 4.69 - 6.13 M/uL   Hemoglobin 15.9 14.1 - 18.1 g/dL   HCT, POC 96.047.4 45.443.5 - 53.7 %   MCV 89.0 80 - 97 fL   MCH, POC 29.9 27 - 31.2 pg   MCHC 33.6 31.8 - 35.4 g/dL   RDW, POC 09.812.7 %   Platelet Count, POC 207 142  - 424 K/uL   MPV 7.1 0 - 99.8 fL    Assessment and Plan :  1. Fever, unspecified fever cause 2. Dizziness - POC Influenza A&B(BINAX/QUICKVUE) - POCT urinalysis dipstick - HSV(herpes simplex vrs) 1+2 ab-IgG - RPR - PR NORMAL SALINE SOLUTION INFUS - PR CATH IMPL VASC ACCESS PORTAL - Orthostatic vital signs - POCT CBC - Pt presents with fever, myalgias and fatigue x 2 days. Hypotensive at presentation, after IV fluids blood pressure improved to 132/80 and pt reported feeling much better. POCT flu is negative. WBC count wnl. Will treat for viral illness. Labs pending for HSV and RPR for lesion on penis, however I suspect this was folliculitis. Encouraged hydration and rest. RTC in 3-5 days if  no improvement or worsening symptoms.  3. Lesion of penis - mupirocin ointment (BACTROBAN) 2 %; Apply 1 application topically 3 (three) times daily.  Dispense: 22 g; Refill: 1   Whitney Sheriann Newmann, PA-C  [facility] at [facility] 11/25/2017 5:01 PM

## 2017-11-26 LAB — HSV(HERPES SIMPLEX VRS) I + II AB-IGG
HSV 1 Glycoprotein G Ab, IgG: <0.91 index (ref 0.00–0.90)
HSV 2 IgG, Type Spec: <0.91 {index} (ref 0.00–0.90)

## 2017-11-26 LAB — RPR: RPR Ser Ql: NONREACTIVE

## 2017-11-27 NOTE — Progress Notes (Signed)
Please call pt and let him know he is negative for herpes and syphilis.  Thank you!

## 2018-01-05 ENCOUNTER — Other Ambulatory Visit: Payer: Self-pay

## 2018-01-05 ENCOUNTER — Encounter: Payer: Self-pay | Admitting: Emergency Medicine

## 2018-01-05 ENCOUNTER — Ambulatory Visit: Payer: BLUE CROSS/BLUE SHIELD | Admitting: Emergency Medicine

## 2018-01-05 VITALS — BP 106/64 | HR 69 | Temp 97.9°F | Resp 16 | Ht 74.25 in | Wt 262.6 lb

## 2018-01-05 DIAGNOSIS — J302 Other seasonal allergic rhinitis: Secondary | ICD-10-CM | POA: Diagnosis not present

## 2018-01-05 DIAGNOSIS — Z23 Encounter for immunization: Secondary | ICD-10-CM

## 2018-01-05 MED ORDER — TRIAMCINOLONE ACETONIDE 55 MCG/ACT NA AERO: 2.0000 | INHALATION_SPRAY | Freq: Every day | NASAL | 12 refills | Status: DC

## 2018-01-05 MED ORDER — PREDNISONE 20 MG PO TABS: 40.0000 mg | ORAL_TABLET | Freq: Every day | ORAL | 0 refills | Status: AC

## 2018-01-05 MED ORDER — CETIRIZINE HCL 10 MG PO TABS: 10.0000 mg | ORAL_TABLET | Freq: Every day | ORAL | 1 refills | Status: DC

## 2018-01-05 MED ORDER — CETIRIZINE HCL 10 MG PO TABS: 10.0000 mg | ORAL_TABLET | Freq: Every day | ORAL | 1 refills | Status: AC

## 2018-01-05 NOTE — Progress Notes (Signed)
Joel Bradley 26 y.o.   Chief Complaint  Patient presents with  . SEASONAL ALLERGY    ITCHY EYES and SNEEZING x 1 week    HISTORY OF PRESENT ILLNESS: This is a 27 y.o. male complaining of 1 week history of itchy eyes, congestion, sneezing, runny nose; has history of seasonal allergies. HPI   Prior to Admission medications   Medication Sig Start Date End Date Taking? Authorizing Provider  ibuprofen (ADVIL,MOTRIN) 800 MG tablet Take 1 tablet (800 mg total) by mouth 3 (three) times daily. 09/04/14  Yes Fayrene Helperran, Bowie, PA-C  acetaminophen (TYLENOL) 500 MG tablet Take 500 mg by mouth every 6 (six) hours as needed.    [provider]  albuterol (PROVENTIL HFA;VENTOLIN HFA) 108 (90 BASE) MCG/ACT inhaler Inhale 2 puffs into the lungs every 4 (four) hours as needed for wheezing or shortness of breath. Patient not taking: Reported on 01/05/2018 03/02/14   Jaynie CrumbleKirichenko, Tatyana, PA-C  cetirizine (ZYRTEC ALLERGY) 10 MG tablet Take 1 tablet (10 mg total) by mouth daily. Patient not taking: Reported on 06/04/2015 02/17/14   Eber HongMiller, Brian, MD  cyclobenzaprine (FLEXERIL) 10 MG tablet Take 1 tablet (10 mg total) by mouth 3 (three) times daily as needed for muscle spasms. Patient not taking: Reported on 11/25/2017 06/04/15   Street, InvernessMercedes, PA-C  methocarbamol (ROBAXIN) 500 MG tablet Take 1 tablet (500 mg total) by mouth 2 (two) times daily. Patient not taking: Reported on 11/25/2017 09/04/14   Fayrene Helperran, Bowie, PA-C  mupirocin ointment (BACTROBAN) 2 % Apply 1 application topically 3 (three) times daily. Patient not taking: Reported on 01/05/2018 11/25/17   McVey, Madelaine BhatElizabeth Whitney, PA-C  naproxen (NAPROSYN) 500 MG tablet Take 1 tablet (500 mg total) by mouth 2 (two) times daily as needed for mild pain, moderate pain or headache (TAKE WITH MEALS.). Patient not taking: Reported on 11/25/2017 06/04/15   Street, PenuelasMercedes, PA-C  naproxen sodium (ANAPROX) 220 MG tablet Take 220 mg by mouth 2 (two) times daily with a  meal.    [provider]  OVER THE COUNTER MEDICATION Apply 1 application topically once. Pain Relief Cream    [provider]    No Known Allergies  There are no active problems to display for this patient.   Past Medical History:  Diagnosis Date  . Allergy   . Seasonal allergies     No past surgical history on file.  Social History   Socioeconomic History  . Marital status: Single    Spouse name: Not on file  . Number of children: Not on file  . Years of education: Not on file  . Highest education level: Not on file  Social Needs  . Financial resource strain: Not on file  . Food insecurity - worry: Not on file  . Food insecurity - inability: Not on file  . Transportation needs - medical: Not on file  . Transportation needs - non-medical: Not on file  Occupational History  . Not on file  Tobacco Use  . Smoking status: Never Smoker  . Smokeless tobacco: Never Used  Substance and Sexual Activity  . Alcohol use: No    Frequency: Never    Comment: occasional   . Drug use: No  . Sexual activity: Not on file  Other Topics Concern  . Not on file  Social History Narrative  . Not on file    No family history on file.   Review of Systems  Constitutional: Negative.  Negative for chills and fever.  HENT: Positive for congestion. Negative for ear discharge, ear pain, nosebleeds and sore throat.        Sneezing, runny nose  Eyes: Negative for blurred vision, double vision, discharge and redness.       Watery itchy eyes  Respiratory: Negative for cough, shortness of breath and wheezing.   Cardiovascular: Negative for chest pain and palpitations.  Gastrointestinal: Negative for abdominal pain, diarrhea, nausea and vomiting.  Musculoskeletal: Negative for back pain, myalgias and neck pain.  Skin: Negative.  Negative for rash.  Neurological: Negative for dizziness and headaches.  Endo/Heme/Allergies: Negative.   All other systems reviewed and are  negative.   Vitals:   01/05/18 1703  BP: 106/64  Pulse: 69  Resp: 16  Temp: 97.9 F (36.6 C)  SpO2: 98%    Physical Exam  Constitutional: He is oriented to person, place, and time. He appears well-developed and well-nourished.  HENT:  Head: Normocephalic and atraumatic.  Right Ear: Tympanic membrane, external ear and ear canal normal.  Left Ear: Tympanic membrane, external ear and ear canal normal.  Nose: Nose normal. No mucosal edema, rhinorrhea or sinus tenderness. Right sinus exhibits no maxillary sinus tenderness and no frontal sinus tenderness. Left sinus exhibits no maxillary sinus tenderness and no frontal sinus tenderness.  Mouth/Throat: Oropharynx is clear and moist.  Eyes: Conjunctivae and EOM are normal. Pupils are equal, round, and reactive to light.  Neck: Normal range of motion. Neck supple. No JVD present.  Cardiovascular: Normal rate, regular rhythm and normal heart sounds.  Pulmonary/Chest: Effort normal and breath sounds normal.  Abdominal: Soft. Bowel sounds are normal. He exhibits no distension.  Musculoskeletal: Normal range of motion.  Lymphadenopathy:    He has no cervical adenopathy.  Neurological: He is alert and oriented to person, place, and time.  Skin: Skin is warm and dry. Capillary refill takes less than 2 seconds. No rash noted.  Psychiatric: He has a normal mood and affect. His behavior is normal.  Vitals reviewed.  A total of 25 minutes was spent in the room with the patient, greater than 50% of which was in counseling/coordination of care.   ASSESSMENT & PLAN: Joel was seen today for seasonal allergy.  Diagnoses and all orders for this visit:  Seasonal allergies -     predniSONE (DELTASONE) 20 MG tablet; Take 2 tablets (40 mg total) by mouth daily with breakfast for 5 days. -     triamcinolone (NASACORT) 55 MCG/ACT AERO nasal inhaler; Place 2 sprays into the nose daily. -     cetirizine (ZYRTEC ALLERGY) 10 MG tablet; Take 1 tablet (10  mg total) by mouth daily.  Need for prophylactic vaccination and inoculation against influenza -     Flu Vaccine QUAD 36+ mos IM  Need for diphtheria-tetanus-pertussis (Tdap) vaccine -     Cancel: Tdap vaccine greater than or equal to 7yo IM   Patient Instructions       IF you received an x-ray today, you will receive an invoice from [facility]. Please contact [facility] at [phone] with questions or concerns regarding your invoice.   IF you received labwork today, you will receive an invoice from [place]. Please contact [facility] at [phone] with questions or concerns regarding your invoice.   Our billing staff will not be able to assist you with questions regarding bills from these companies.  You will be contacted with the lab results as soon as they are available. The fastest way to get your results is to  activate your My Chart account. Instructions are located on the last page of this paperwork. If you have not heard from [place] regarding the results in 2 weeks, please contact this office.    Allergic Rhinitis, Adult Allergic rhinitis is an allergic reaction that affects the mucous membrane inside the nose. It causes sneezing, a runny or stuffy nose, and the feeling of mucus going down the back of the throat (postnasal drip). Allergic rhinitis can be mild to severe. There are two types of allergic rhinitis:  Seasonal. This type is also called hay fever. It happens only during certain seasons.  Perennial. This type can happen at any time of the year.  What are the causes? This condition happens when the body's defense system (immune system) responds to certain harmless substances called allergens as though they were germs.  Seasonal allergic rhinitis is triggered by pollen, which can come from grasses, trees, and weeds. Perennial allergic rhinitis may be caused by:  House dust mites.  Pet dander.  Mold spores.  What are the signs or  symptoms? Symptoms of this condition include:  Sneezing.  Runny or stuffy nose (nasal congestion).  Postnasal drip.  Itchy nose.  Tearing of the eyes.  Trouble sleeping.  Daytime sleepiness.  How is this diagnosed? This condition may be diagnosed based on:  Your medical history.  A physical exam.  Tests to check for related conditions, such as: ? Asthma. ? Pink eye. ? Ear infection. ? Upper respiratory infection.  Tests to find out which allergens trigger your symptoms. These may include skin or blood tests.  How is this treated? There is no cure for this condition, but treatment can help control symptoms. Treatment may include:  Taking medicines that block allergy symptoms, such as antihistamines. Medicine may be given as a shot, nasal spray, or pill.  Avoiding the allergen.  Desensitization. This treatment involves getting ongoing shots until your body becomes less sensitive to the allergen. This treatment may be done if other treatments do not help.  If taking medicine and avoiding the allergen does not work, new, stronger medicines may be prescribed.  Follow these instructions at home:  Find out what you are allergic to. Common allergens include smoke, dust, and pollen.  Avoid the things you are allergic to. These are some things you can do to help avoid allergens: ? Replace carpet with wood, tile, or vinyl flooring. Carpet can trap dander and dust. ? Do not smoke. Do not allow smoking in your home. ? Change your heating and air conditioning filter at least once a month. ? During allergy season:  Keep windows closed as much as possible.  Plan outdoor activities when pollen counts are lowest. This is usually during the evening hours.  When coming indoors, change clothing and shower before sitting on furniture or bedding.  Take over-the-counter and prescription medicines only as told by your health care provider.  Keep all follow-up visits as told by  your health care provider. This is important. Contact a health care provider if:  You have a fever.  You develop a persistent cough.  You make whistling sounds when you breathe (you wheeze).  Your symptoms interfere with your normal daily activities. Get help right away if:  You have shortness of breath. Summary  This condition can be managed by taking medicines as directed and avoiding allergens.  Contact your health care provider if you develop a persistent cough or fever.  During allergy season, keep windows closed as much as  possible. This information is not intended to replace advice given to you by your health care provider. Make sure you discuss any questions you have with your health care provider. Document Released: 07/29/2001 Document Revised: 12/11/2016 Document Reviewed: 12/11/2016 Elsevier Interactive Patient Education  2018 [employer].      Edwina Barth, MD [facility]

## 2018-01-05 NOTE — Patient Instructions (Addendum)
   IF you received an x-ray today, you will receive an invoice from [facility]. Please contact [facility] at [phone] with questions or concerns regarding your invoice.   IF you received labwork today, you will receive an invoice from [facility]. Please contact [facility] at [phone] with questions or concerns regarding your invoice.   Our billing staff will not be able to assist you with questions regarding bills from these companies.  You will be contacted with the lab results as soon as they are available. The fastest way to get your results is to activate your My Chart account. Instructions are located on the last page of this paperwork. If you have not heard from us regarding the results in 2 weeks, please contact this office.    Allergic Rhinitis, Adult Allergic rhinitis is an allergic reaction that affects the mucous membrane inside the nose. It causes sneezing, a runny or stuffy nose, and the feeling of mucus going down the back of the throat (postnasal drip). Allergic rhinitis can be mild to severe. There are two types of allergic rhinitis:  Seasonal. This type is also called hay fever. It happens only during certain seasons.  Perennial. This type can happen at any time of the year.  What are the causes? This condition happens when the body's defense system (immune system) responds to certain harmless substances called allergens as though they were germs.  Seasonal allergic rhinitis is triggered by pollen, which can come from grasses, trees, and weeds. Perennial allergic rhinitis may be caused by:  House dust mites.  Pet dander.  Mold spores.  What are the signs or symptoms? Symptoms of this condition include:  Sneezing.  Runny or stuffy nose (nasal congestion).  Postnasal drip.  Itchy nose.  Tearing of the eyes.  Trouble sleeping.  Daytime sleepiness.  How is this diagnosed? This condition may be diagnosed based on:  Your medical  history.  A physical exam.  Tests to check for related conditions, such as: ? Asthma. ? Pink eye. ? Ear infection. ? Upper respiratory infection.  Tests to find out which allergens trigger your symptoms. These may include skin or blood tests.  How is this treated? There is no cure for this condition, but treatment can help control symptoms. Treatment may include:  Taking medicines that block allergy symptoms, such as antihistamines. Medicine may be given as a shot, nasal spray, or pill.  Avoiding the allergen.  Desensitization. This treatment involves getting ongoing shots until your body becomes less sensitive to the allergen. This treatment may be done if other treatments do not help.  If taking medicine and avoiding the allergen does not work, new, stronger medicines may be prescribed.  Follow these instructions at home:  Find out what you are allergic to. Common allergens include smoke, dust, and pollen.  Avoid the things you are allergic to. These are some things you can do to help avoid allergens: ? Replace carpet with wood, tile, or vinyl flooring. Carpet can trap dander and dust. ? Do not smoke. Do not allow smoking in your home. ? Change your heating and air conditioning filter at least once a month. ? During allergy season:  Keep windows closed as much as possible.  Plan outdoor activities when pollen counts are lowest. This is usually during the evening hours.  When coming indoors, change clothing and shower before sitting on furniture or bedding.  Take over-the-counter and prescription medicines only as told by your health care provider.  Keep all   follow-up visits as told by your health care provider. This is important. Contact a health care provider if:  You have a fever.  You develop a persistent cough.  You make whistling sounds when you breathe (you wheeze).  Your symptoms interfere with your normal daily activities. Get help right away if:  You  have shortness of breath. Summary  This condition can be managed by taking medicines as directed and avoiding allergens.  Contact your health care provider if you develop a persistent cough or fever.  During allergy season, keep windows closed as much as possible. This information is not intended to replace advice given to you by your health care provider. Make sure you discuss any questions you have with your health care provider. Document Released: 07/29/2001 Document Revised: 12/11/2016 Document Reviewed: 12/11/2016 Elsevier Interactive Patient Education  2018 [employer] Inc[employer]  

## 2018-01-05 NOTE — Addendum Note (Signed)
Addended by: Evie LacksSAGARDIA, Melora Menon J on: 01/05/2018 06:13 PM   Modules accepted: Orders

## 2018-02-13 ENCOUNTER — Encounter: Payer: Self-pay | Admitting: Emergency Medicine

## 2018-02-15 ENCOUNTER — Other Ambulatory Visit: Payer: Self-pay | Admitting: Emergency Medicine

## 2018-02-15 MED ORDER — MONTELUKAST SODIUM 10 MG PO TABS: 10.0000 mg | ORAL_TABLET | Freq: Every day | ORAL | 3 refills | Status: AC

## 2018-02-15 MED ORDER — LEVOCETIRIZINE DIHYDROCHLORIDE 5 MG PO TABS: 5.0000 mg | ORAL_TABLET | Freq: Every evening | ORAL | 1 refills | Status: AC

## 2018-02-15 NOTE — Progress Notes (Signed)
Singulair and Xyzal prescribed for seasonal allergies.

## 2018-02-26 ENCOUNTER — Encounter: Payer: Self-pay | Admitting: Physician Assistant

## 2018-02-26 ENCOUNTER — Other Ambulatory Visit: Payer: Self-pay

## 2018-02-26 ENCOUNTER — Ambulatory Visit: Payer: BLUE CROSS/BLUE SHIELD | Admitting: Physician Assistant

## 2018-02-26 VITALS — BP 125/69 | HR 66 | Temp 98.3°F | Resp 16 | Ht 74.0 in | Wt 275.8 lb

## 2018-02-26 DIAGNOSIS — Z7185 Encounter for immunization safety counseling: Secondary | ICD-10-CM

## 2018-02-26 DIAGNOSIS — Z23 Encounter for immunization: Secondary | ICD-10-CM

## 2018-02-26 DIAGNOSIS — Z7189 Other specified counseling: Secondary | ICD-10-CM | POA: Diagnosis not present

## 2018-02-26 NOTE — Patient Instructions (Addendum)
For allergies try:  Claritin-D (you have to buy this from pharmacist) and Flonase   Unfortunately you may have to get allergy shots.  Also consider acupuncture ([facility] or [place]).   Thank you for coming in today. I hope you feel we met your needs.  Feel free to call PCP if you have any questions or further requests.  Please consider signing up for MyChart if you do not already have it, as this is a great way to communicate with me.  Best,  Whitney McVey, PA-C  IF you received an x-ray today, you will receive an invoice from [facility]. Please contact [facility] at [phone] with questions or concerns regarding your invoice.   IF you received labwork today, you will receive an invoice from [place]. Please contact [facility] at [phone] with questions or concerns regarding your invoice.   Our billing staff will not be able to assist you with questions regarding bills from these companies.  You will be contacted with the lab results as soon as they are available. The fastest way to get your results is to activate your My Chart account. Instructions are located on the last page of this paperwork. If you have not heard from [place] regarding the results in 2 weeks, please contact this office.

## 2018-02-26 NOTE — Progress Notes (Addendum)
Joel Bradley  MRN: [id number] DOB: 08/14/1991  PCP: Daisy Florooss, Charles Alan, MD  Subjective:  Pt is a 27 year old male who presents to clinic for immunization. He and his wife had a baby one month ago and he would like tdap. He is feeling well today, no recent illness. ROS below.   Review of Systems  Constitutional: Negative for chills, diaphoresis, fatigue and fever.  HENT: Negative for congestion, postnasal drip, rhinorrhea, sinus pressure, sinus pain, sneezing and sore throat.   Respiratory: Negative for cough, shortness of breath and wheezing.     Patient Active Problem List   Diagnosis Date Noted  . Need for prophylactic vaccination and inoculation against influenza 01/05/2018  . Seasonal allergies 01/05/2018    Current Outpatient Medications on File Prior to Visit  Medication Sig Dispense Refill  . cetirizine (ZYRTEC ALLERGY) 10 MG tablet Take 1 tablet (10 mg total) by mouth daily. 30 tablet 1  . levocetirizine (XYZAL) 5 MG tablet Take 1 tablet (5 mg total) by mouth every evening. 30 tablet 1  . montelukast (SINGULAIR) 10 MG tablet Take 1 tablet (10 mg total) by mouth at bedtime. 30 tablet 3  . albuterol (PROVENTIL HFA;VENTOLIN HFA) 108 (90 BASE) MCG/ACT inhaler Inhale 2 puffs into the lungs every 4 (four) hours as needed for wheezing or shortness of breath. (Patient not taking: Reported on 01/05/2018) 1 Inhaler 0  . cyclobenzaprine (FLEXERIL) 10 MG tablet Take 1 tablet (10 mg total) by mouth 3 (three) times daily as needed for muscle spasms. (Patient not taking: Reported on 11/25/2017) 15 tablet 0  . methocarbamol (ROBAXIN) 500 MG tablet Take 1 tablet (500 mg total) by mouth 2 (two) times daily. (Patient not taking: Reported on 11/25/2017) 20 tablet 0  . mupirocin ointment (BACTROBAN) 2 % Apply 1 application topically 3 (three) times daily. (Patient not taking: Reported on 01/05/2018) 22 g 1  . naproxen (NAPROSYN) 500 MG tablet Take 1 tablet (500 mg total) by mouth 2 (two) times  daily as needed for mild pain, moderate pain or headache (TAKE WITH MEALS.). (Patient not taking: Reported on 11/25/2017) 20 tablet 0  . naproxen sodium (ANAPROX) 220 MG tablet Take 220 mg by mouth 2 (two) times daily with a meal.    . OVER THE COUNTER MEDICATION Apply 1 application topically once. Pain Relief Cream    . triamcinolone (NASACORT) 55 MCG/ACT AERO nasal inhaler Place 2 sprays into the nose daily. (Patient not taking: Reported on 02/26/2018) 1 Inhaler 12   No current facility-administered medications on file prior to visit.     No Known Allergies   Objective:  BP 125/69   Pulse 66   Temp 98.3 F (36.8 C) (Oral)   Resp 16   Ht 6\' 2"  (1.88 m)   Wt 275 lb 12.8 oz (125.1 kg)   SpO2 97%   BMI 35.41 kg/m   Physical Exam  Constitutional: He is oriented to person, place, and time. He appears well-developed and well-nourished.  Cardiovascular: Normal rate and regular rhythm.  Pulmonary/Chest: Effort normal. No respiratory distress.  Neurological: He is alert and oriented to person, place, and time.  Skin: Skin is warm and dry.  Psychiatric: He has a normal mood and affect. His behavior is normal. Judgment and thought content normal.  Vitals reviewed.   Assessment and Plan :  1. Immunization counseling 2. Encounter for immunization 3. Need for immunization against tetanus alone - Tdap vaccine greater than or equal to 7yo IM - Pt  presents for tdap. He and his wife had a baby one month ago.   Marco Collie, PA-C  Primary Care at [facility] 02/26/2018 4:54 PM

## 2018-11-06 ENCOUNTER — Encounter: Payer: Self-pay | Admitting: Physician Assistant

## 2018-12-30 ENCOUNTER — Encounter: Payer: Self-pay | Admitting: Physician Assistant

## 2019-02-21 ENCOUNTER — Other Ambulatory Visit: Payer: Self-pay

## 2019-02-21 ENCOUNTER — Emergency Department (HOSPITAL_COMMUNITY)
Admission: EM | Admit: 2019-02-21 | Discharge: 2019-02-22 | Disposition: A | Discharge status: Home / Self Care | Payer: BLUE CROSS/BLUE SHIELD | Attending: Emergency Medicine | Admitting: Emergency Medicine

## 2019-02-21 ENCOUNTER — Encounter (HOSPITAL_COMMUNITY): Payer: Self-pay | Admitting: Emergency Medicine

## 2019-02-21 DIAGNOSIS — Z79899 Other long term (current) drug therapy: Secondary | ICD-10-CM | POA: Diagnosis not present

## 2019-02-21 DIAGNOSIS — J9809 Other diseases of bronchus, not elsewhere classified: Secondary | ICD-10-CM

## 2019-02-21 DIAGNOSIS — J301 Allergic rhinitis due to pollen: Secondary | ICD-10-CM | POA: Insufficient documentation

## 2019-02-21 DIAGNOSIS — R05 Cough: Secondary | ICD-10-CM | POA: Diagnosis present

## 2019-02-21 NOTE — ED Triage Notes (Addendum)
Patient reports sinus allergies from pollen onset last week with rhinorrhea /sneezing , nasal congestion and occasional productive cough . Denies fever or chills , no travel history /no sick contact .

## 2019-02-22 MED ORDER — ALBUTEROL SULFATE HFA 108 (90 BASE) MCG/ACT IN AERS
8.0000 | INHALATION_SPRAY | Freq: Once | RESPIRATORY_TRACT | Status: AC
  Administered 2019-02-22: 8 via RESPIRATORY_TRACT
  Filled 2019-02-22: qty 6.7

## 2019-02-22 MED ORDER — AEROCHAMBER PLUS FLO-VU LARGE MISC
1.0000 | Freq: Once | Status: AC
  Administered 2019-02-22: 02:00:00 1

## 2019-02-22 MED ORDER — AEROCHAMBER PLUS FLO-VU LARGE MISC
Status: AC
  Filled 2019-02-22: qty 1

## 2019-02-22 NOTE — Discharge Instructions (Signed)
Thank you for allowing me to care for you today in the Emergency Department.   Continue to take your Allegra-D at home.  You may use Benadryl as directed on the label if you have to have sneezing, runny nose, or itching. Flonase is available over-the-counter and can be used to help with the symptoms as well.  Use 2 puffs of the albuterol inhaler with a spacer every 4 hours as needed for episodes of dry cough, chest tightness, shortness of breath, or wheezing.  If you do not have a primary care provider, you can call the number on your discharge paperwork to get established with 1 for follow-up.  Return to the emergency department if you feel as if your throat is closing, develop a head to toe rash, severe shortness of breath, respiratory distress, or other new, concerning symptoms.

## 2019-02-22 NOTE — ED Provider Notes (Signed)
[facility] EMERGENCY DEPARTMENT Provider Note   CSN: [id number] Arrival date & time: 02/21/19  2336    History   Chief Complaint Chief Complaint  Patient presents with  . Sinus Allergies    HPI Joel Bradley is a 28 y.o. male with a history of seasonal allergies who presents to the emergency department with a chief complaint of cough. The patient endorses several days of rhinorrhea, sneezing, postnasal drip, sore throat that is worse in the morning.  He reports that he has developed worsening chest tightness and a cough that is intermittently productive over the last 24 hours.  He states I feel like I been coughing nonstop over the last 9 hours.  He reports that he is starting to feel pain in his ribs and back from the intensity of the cough.  He has been treating his symptoms with Allegra-D and Benadryl with some improvement.  He reports he is also been using a humidifier in his home.  He reports a history of similar symptoms that improved after an albuterol nebulizer treatment.  He does not have an albuterol inhaler or nebulizer at home.  He denies fever, wheezing, nausea, vomiting, diarrhea, leg swelling, palpitations, muffled voice, trismus, rash, or abdominal pain.   He reports a history of similar symptoms seasonally in the Spring. No history of asthma.      The history is provided by the patient. No language interpreter was used.    Past Medical History:  Diagnosis Date  . Allergy   . Seasonal allergies     Patient Active Problem List   Diagnosis Date Noted  . Need for prophylactic vaccination and inoculation against influenza 01/05/2018  . Seasonal allergies 01/05/2018    History reviewed. No pertinent surgical history.      Home Medications    Prior to Admission medications   Medication Sig Start Date End Date Taking? Authorizing Provider  cetirizine (ZYRTEC ALLERGY) 10 MG tablet Take 1 tablet (10 mg total) by mouth daily. 01/05/18    Georgina Quint, MD  levocetirizine (XYZAL) 5 MG tablet Take 1 tablet (5 mg total) by mouth every evening. 02/15/18 03/17/18  Georgina Quint, MD  montelukast (SINGULAIR) 10 MG tablet Take 1 tablet (10 mg total) by mouth at bedtime. 02/15/18   Georgina Quint, MD  naproxen sodium (ANAPROX) 220 MG tablet Take 220 mg by mouth 2 (two) times daily with a meal.    [provider]  OVER THE COUNTER MEDICATION Apply 1 application topically once. Pain Relief Cream    [provider]    Family History No family history on file.  Social History Social History   Tobacco Use  . Smoking status: Never Smoker  . Smokeless tobacco: Never Used  Substance Use Topics  . Alcohol use: No    Frequency: Never    Comment: occasional   . Drug use: No     Allergies   Patient has no known allergies.   Review of Systems Review of Systems  Constitutional: Negative for appetite change, chills and fever.  HENT: Positive for congestion, postnasal drip, rhinorrhea, sneezing and sore throat. Negative for ear discharge, ear pain, facial swelling and trouble swallowing.   Respiratory: Positive for cough, chest tightness and shortness of breath. Negative for wheezing.   Cardiovascular: Negative for chest pain, palpitations and leg swelling.  Gastrointestinal: Negative for abdominal pain, diarrhea, nausea and vomiting.  Genitourinary: Negative for dysuria.  Musculoskeletal: Negative for back pain, myalgias, neck  pain and neck stiffness.  Skin: Negative for rash.  Allergic/Immunologic: Negative for immunocompromised state.  Neurological: Negative for weakness and headaches.  Psychiatric/Behavioral: Negative for confusion.   Physical Exam Updated Vital Signs BP (!) 144/95 (BP Location: Right Arm)   Pulse 100   Temp 98.5 F (36.9 C) (Oral)   Resp 16   SpO2 99%   Physical Exam Vitals signs and nursing note reviewed.  Constitutional:      Appearance: He is well-developed. He  is not ill-appearing, toxic-appearing or diaphoretic.  HENT:     Head: Normocephalic.     Right Ear: Hearing and ear canal normal. A middle ear effusion is present.     Left Ear: Hearing and ear canal normal. A middle ear effusion is present.     Nose: Mucosal edema and rhinorrhea present.     Right Sinus: No maxillary sinus tenderness or frontal sinus tenderness.     Left Sinus: No maxillary sinus tenderness or frontal sinus tenderness.     Mouth/Throat:     Pharynx: Oropharynx is clear. Uvula midline. No pharyngeal swelling, oropharyngeal exudate, posterior oropharyngeal erythema or uvula swelling.  Eyes:     Extraocular Movements: Extraocular movements intact.     Conjunctiva/sclera: Conjunctivae normal.     Right eye: Right conjunctiva is not injected.     Left eye: Left conjunctiva is not injected.     Pupils: Pupils are equal, round, and reactive to light.  Neck:     Musculoskeletal: Neck supple.  Cardiovascular:     Rate and Rhythm: Normal rate and regular rhythm.     Pulses: Normal pulses.     Heart sounds: Normal heart sounds. No murmur. No friction rub. No gallop.   Pulmonary:     Effort: Pulmonary effort is normal. No respiratory distress.     Breath sounds: No stridor. No wheezing, rhonchi or rales.     Comments: Lung sounds are diminished bilaterally, but otherwise clear to auscultation.  No tachypnea, retractions, accessory muscle use, or nasal flaring.  No respiratory distress. Chest:     Chest wall: No tenderness.  Abdominal:     General: There is no distension.     Palpations: Abdomen is soft. There is no mass.     Tenderness: There is no abdominal tenderness. There is no right CVA tenderness, left CVA tenderness, guarding or rebound.     Hernia: No hernia is present.  Musculoskeletal:     Comments: No lower extremity edema  Skin:    General: Skin is warm and dry.  Neurological:     Mental Status: He is alert.  Psychiatric:        Behavior: Behavior normal.       ED Treatments / Results  Labs (all labs ordered are listed, but only abnormal results are displayed) Labs Reviewed - No data to display  EKG None  Radiology No results found.  Procedures Procedures (including critical care time)  Medications Ordered in ED Medications  AeroChamber Plus Flo-Vu Large MISC 1 each (1 each Other Given 02/22/19 0227)  albuterol (PROVENTIL HFA;VENTOLIN HFA) 108 (90 Base) MCG/ACT inhaler 8 puff (8 puffs Inhalation Given 02/22/19 0227)     Initial Impression / Assessment and Plan / ED Course  I have reviewed the triage vital signs and the nursing notes.  Pertinent labs & imaging results that were available during my care of the patient were reviewed by me and considered in my medical decision making (see chart for details).  [age] year old male with a history of seasonal allergies presenting with worsening cough and chest tightness over the last 24 hours in the setting of rhinorrhea, sneezing, postnasal drip, sore throat over the last few days.  He has been taking Allegra-D and Benadryl with some improvement.  He thinks he would benefit from an albuterol nebulizer treatment.  He does not have an albuterol inhaler or nebulizer at home.  Vital signs are reassuring with an SaO2 of 99%.  He does not have tachycardia and is afebrile.    He is also requesting a course of prednisone as he reports he is improved in the past after a prednisone burst.  Shared decision making conversation with the patient.  Offered the patient 8 puffs of an albuterol inhaler with a spacer in place of a nebulizer treatment due to increased risk from COVID-19 pandemic.  We also discussed the risk versus benefit of prednisone burst and starting this medication during COVID-19 pandemic.  He has some concerned that a coworker may have tested positive, but is unsure.  After discussing the risk versus benefit, he reports that after albuterol use that his breathing is much better and does  not think he would benefit from a prednisone course.  On re-examination, he has good air movement bilaterally in the lungs with minimal end expiratory wheezes throughout. I think this plan is reasonable based on re-examination.  At this time, I feel that no further urgent or emergent work-up is indicated.  I have advised him to continue his home medications and use 2 puffs of albuterol with a spacer as needed.  Low suspicion for COVID-19, PE, influenza, COPD exacerbation, or acute presentation of CHF.  He is well-appearing and hemodynamically stable with no acute distress.  Safe for discharge to home with outpatient follow-up.  Final Clinical Impressions(s) / ED Diagnoses   Final diagnoses:  Bronchospastic airway disease  Seasonal allergic rhinitis due to pollen    ED Discharge Orders    None       Barkley BoardsMcDonald, Teofilo Lupinacci A, PA-C 02/22/19 0327    Mesner, Barbara CowerJason, MD 02/22/19 [phone]

## 2019-02-22 NOTE — ED Notes (Signed)
Called for room X1 no answer

## 2020-04-10 ENCOUNTER — Encounter (HOSPITAL_COMMUNITY): Payer: Self-pay

## 2020-04-10 ENCOUNTER — Ambulatory Visit (HOSPITAL_COMMUNITY)
Admission: EM | Admit: 2020-04-10 | Discharge: 2020-04-10 | Disposition: A | Discharge status: Home / Self Care | Payer: BLUE CROSS/BLUE SHIELD | Attending: Family Medicine | Admitting: Family Medicine

## 2020-04-10 ENCOUNTER — Other Ambulatory Visit: Payer: Self-pay

## 2020-04-10 ENCOUNTER — Ambulatory Visit (INDEPENDENT_AMBULATORY_CARE_PROVIDER_SITE_OTHER): Payer: BLUE CROSS/BLUE SHIELD

## 2020-04-10 DIAGNOSIS — M5136 Other intervertebral disc degeneration, lumbar region: Secondary | ICD-10-CM

## 2020-04-10 MED ORDER — KETOROLAC TROMETHAMINE 30 MG/ML IJ SOLN
30.0000 mg | Freq: Once | INTRAMUSCULAR | Status: AC
  Administered 2020-04-10: 30 mg via INTRAMUSCULAR

## 2020-04-10 MED ORDER — CYCLOBENZAPRINE HCL 10 MG PO TABS
10.0000 mg | ORAL_TABLET | Freq: Two times a day (BID) | ORAL | 0 refills | Status: DC | PRN
Start: 2020-04-10 — End: 2021-02-06

## 2020-04-10 MED ORDER — PREDNISONE 10 MG (21) PO TBPK
ORAL_TABLET | ORAL | 0 refills | Status: DC
Start: 2020-04-10 — End: 2021-02-06

## 2020-04-10 MED ORDER — KETOROLAC TROMETHAMINE 30 MG/ML IJ SOLN
INTRAMUSCULAR | Status: AC
  Filled 2020-04-10: qty 1

## 2020-04-10 NOTE — ED Provider Notes (Addendum)
[place]    CSN: [id number] Arrival date & time: 04/10/20  1456      History   Chief Complaint Chief Complaint  Patient presents with  . Back Pain    HPI Joel Bradley is a 29 y.o. male.   Patient is a 61-year male who presents today with severe back pain.  This started this morning.  Reporting he woke up this way.  Denies any falls, injuries or heavy lifting.  Similar incident of back pain approximately 4 or 5 years ago but this is worse.  Throughout the day the pain has worsened with radiation of pain into both legs.  Describes the pain as sharp and stabbing and then dull aching at times.  Denies any associated numbness, tingling or weakness in extremities.  Denies any bowel or bladder problems.  Took Goody's powder and 2 Aleve without any relief.  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  No fevers, dysuria, hematuria or urinary frequency.  ROS per HPI      Past Medical History:  Diagnosis Date  . Allergy   . Seasonal allergies     Patient Active Problem List   Diagnosis Date Noted  . Need for prophylactic vaccination and inoculation against influenza 01/05/2018  . Seasonal allergies 01/05/2018    History reviewed. No pertinent surgical history.     Home Medications    Prior to Admission medications   Medication Sig Start Date End Date Taking? Authorizing Provider  cetirizine (ZYRTEC ALLERGY) 10 MG tablet Take 1 tablet (10 mg total) by mouth daily. 01/05/18   Horald Pollen, MD  cyclobenzaprine (FLEXERIL) 10 MG tablet Take 1 tablet (10 mg total) by mouth 2 (two) times daily as needed for muscle spasms. 04/10/20   Loura Halt A, NP  levocetirizine (XYZAL) 5 MG tablet Take 1 tablet (5 mg total) by mouth every evening. 02/15/18 03/17/18  Horald Pollen, MD  montelukast (SINGULAIR) 10 MG tablet Take 1 tablet (10 mg total) by mouth at bedtime. 02/15/18    Horald Pollen, MD  naproxen sodium (ANAPROX) 220 MG tablet Take 220 mg by mouth 2 (two) times daily with a meal.    [provider]  OVER THE COUNTER MEDICATION Apply 1 application topically once. Pain Relief Cream    [provider]  predniSONE (STERAPRED UNI-PAK 21 TAB) 10 MG (21) TBPK tablet 6 tabs for 1 day, then 5 tabs for 1 das, then 4 tabs for 1 day, then 3 tabs for 1 day, 2 tabs for 1 day, then 1 tab for 1 day 04/10/20   Orvan July, NP    Family History History reviewed. No pertinent family history.  Social History Social History   Tobacco Use  . Smoking status: Never Smoker  . Smokeless tobacco: Never Used  Substance Use Topics  . Alcohol use: No    Comment: occasional   . Drug use: No     Allergies   Patient has no known allergies.   Review of Systems Review of Systems   Physical Exam Triage Vital Signs ED Triage Vitals  Enc Vitals Group     BP 04/10/20 1546 124/76     Pulse Rate 04/10/20 1546 81     Resp 04/10/20 1546 18     Temp 04/10/20 1546 98.1 F (36.7 C)     Temp Source 04/10/20 1546  Oral     SpO2 04/10/20 1546 97 %     Weight --      Height --      Head Circumference --      Peak Flow --      Pain Score 04/10/20 1544 10     Pain Loc --      Pain Edu? --      Excl. in GC? --    No data found.  Updated Vital Signs BP 124/76 (BP Location: Right Arm)   Pulse 81   Temp 98.1 F (36.7 C) (Oral)   Resp 18   SpO2 97%   Visual Acuity Right Eye Distance:   Left Eye Distance:   Bilateral Distance:    Right Eye Near:   Left Eye Near:    Bilateral Near:     Physical Exam Vitals and nursing note reviewed.  Constitutional:      Appearance: Normal appearance.  HENT:     Head: Normocephalic and atraumatic.     Nose: Nose normal.  Eyes:     Conjunctiva/sclera: Conjunctivae normal.  Pulmonary:     Effort: Pulmonary effort is normal.  Musculoskeletal:     Cervical back: Normal range of motion.     Lumbar back:  Tenderness and bony tenderness present. Decreased range of motion.       Back:     Comments: No specific pain with bending over.  More pain with sitting and lying down.  Skin:    General: Skin is warm and dry.  Neurological:     Mental Status: He is alert.  Psychiatric:        Mood and Affect: Mood normal.      UC Treatments / Results  Labs (all labs ordered are listed, but only abnormal results are displayed) Labs Reviewed - No data to display  EKG   Radiology DG Lumbar Spine Complete  Result Date: 04/10/2020 CLINICAL DATA:  Low back pain, no known injury, initial encounter EXAM: LUMBAR SPINE - COMPLETE 4+ VIEW COMPARISON:  None. FINDINGS: Five lumbar type vertebral bodies are well visualized. Vertebral body height is well maintained. No pars defects are noted. No anterolisthesis is seen. Mild disc space narrowing is noted at L4-5. No other focal abnormality is noted. IMPRESSION: Mild degenerative change without acute abnormality. Electronically Signed   By: Alcide Clever M.D.   On: 04/10/2020 16:25    Procedures Procedures (including critical care time)  Medications Ordered in UC Medications  ketorolac (TORADOL) 30 MG/ML injection 30 mg (30 mg Intramuscular Given 04/10/20 1638)    Initial Impression / Assessment and Plan / UC Course  I have reviewed the triage vital signs and the nursing notes.  Pertinent labs & imaging results that were available during my care of the patient were reviewed by me and considered in my medical decision making (see chart for details).    Degenerative disc disease X-ray reviewed with degenerative disc disease and disc space narrowing at L4-L5. Most likely some sciatic nerve involvement.  No red flags on exam.  Will prescribe prednisone daily taper for the next 6 days. Flexeril as needed for muscle spasming.  Toradol injection given here for acute pain Recommend rest, alternate heat and ice Follow up as needed for continued or worsening  symptoms  Final Clinical Impressions(s) / UC Diagnoses   Final diagnoses:  DDD (degenerative disc disease), lumbar     Discharge Instructions     You have some degenerative changes and mild disc  space narrowing at the L4-L5 area lumbar spine.  This is most likely the cause of your discomfort. Believe it is causing some nerve impingement and inflammation I will treat this with prednisone taper over the next 6 days. Take this with food.  Flexeril as needed for muscle relaxant. This may make you drowsy Follow up as needed for continued or worsening symptoms     ED Prescriptions    Medication Sig Dispense Auth. Provider   predniSONE (STERAPRED UNI-PAK 21 TAB) 10 MG (21) TBPK tablet 6 tabs for 1 day, then 5 tabs for 1 das, then 4 tabs for 1 day, then 3 tabs for 1 day, 2 tabs for 1 day, then 1 tab for 1 day 21 tablet Philander Ake A, NP   cyclobenzaprine (FLEXERIL) 10 MG tablet Take 1 tablet (10 mg total) by mouth 2 (two) times daily as needed for muscle spasms. 20 tablet Dahlia Byes A, NP     PDMP not reviewed this encounter.       Dahlia Byes A, NP 04/11/20 1007

## 2020-04-10 NOTE — ED Triage Notes (Signed)
Pt presents with bilateral lower back pain that radiates down into legs since waking up this morning.

## 2020-04-10 NOTE — Discharge Instructions (Addendum)
You have some degenerative changes and mild disc space narrowing at the L4-L5 area lumbar spine.  This is most likely the cause of your discomfort. Believe it is causing some nerve impingement and inflammation I will treat this with prednisone taper over the next 6 days. Take this with food.  Flexeril as needed for muscle relaxant. This may make you drowsy Follow up as needed for continued or worsening symptoms

## 2021-02-06 ENCOUNTER — Ambulatory Visit
Admission: EM | Admit: 2021-02-06 | Discharge: 2021-02-06 | Disposition: A | Discharge status: Home / Self Care | Payer: BLUE CROSS/BLUE SHIELD | Attending: Emergency Medicine | Admitting: Emergency Medicine

## 2021-02-06 ENCOUNTER — Other Ambulatory Visit: Payer: Self-pay

## 2021-02-06 DIAGNOSIS — R112 Nausea with vomiting, unspecified: Secondary | ICD-10-CM | POA: Diagnosis not present

## 2021-02-06 DIAGNOSIS — R197 Diarrhea, unspecified: Secondary | ICD-10-CM | POA: Diagnosis not present

## 2021-02-06 MED ORDER — ONDANSETRON 4 MG PO TBDP: 4.0000 mg | ORAL_TABLET | Freq: Three times a day (TID) | ORAL | 0 refills | Status: AC | PRN

## 2021-02-06 NOTE — ED Triage Notes (Addendum)
Pt c/o abdominal distention and vomiting x2 since 430am. States at [employer] at 630pm yesterday. States after vomiting his stomach felt better. Unable to keep anything down. States had fever and chills also today, took ibuprofen at 230pm. States needs work note.

## 2021-02-06 NOTE — Discharge Instructions (Signed)
Zofran dissolved in mouth as needed for nausea and vomiting Gradually increase fluids May use over-the-counter Pepcid to help with underlying indigestion Continue to monitor symptoms over the next few days, follow-up if not improving or worsening

## 2021-02-07 NOTE — ED Provider Notes (Signed)
[facility]    CSN: [id number] Arrival date & time: 02/06/21  1618      History   Chief Complaint Chief Complaint  Patient presents with  . Emesis    HPI Joel Bradley is a 30 y.o. male presenting today for evaluation of nausea vomiting and diarrhea.  Symptoms began earlier this morning.  Has had 2 episodes of vomiting, but has had a lot of diarrhea.  Slight abdominal discomfort.  Slight fever and chills.  HPI  Past Medical History:  Diagnosis Date  . Allergy   . Seasonal allergies     Patient Active Problem List   Diagnosis Date Noted  . Need for prophylactic vaccination and inoculation against influenza 01/05/2018  . Seasonal allergies 01/05/2018    History reviewed. No pertinent surgical history.     Home Medications    Prior to Admission medications   Medication Sig Start Date End Date Taking? Authorizing Provider  ondansetron (ZOFRAN ODT) 4 MG disintegrating tablet Take 1-2 tablets (4-8 mg total) by mouth every 8 (eight) hours as needed for nausea or vomiting. 02/06/21  Yes Tsuyako Jolley C, PA-C  cetirizine (ZYRTEC ALLERGY) 10 MG tablet Take 1 tablet (10 mg total) by mouth daily. 01/05/18   Georgina Quint, MD  levocetirizine (XYZAL) 5 MG tablet Take 1 tablet (5 mg total) by mouth every evening. 02/15/18 03/17/18  Georgina Quint, MD  montelukast (SINGULAIR) 10 MG tablet Take 1 tablet (10 mg total) by mouth at bedtime. 02/15/18   Georgina Quint, MD  naproxen sodium (ANAPROX) 220 MG tablet Take 220 mg by mouth 2 (two) times daily with a meal.    [provider]  OVER THE COUNTER MEDICATION Apply 1 application topically once. Pain Relief Cream    [provider]    Family History History reviewed. No pertinent family history.  Social History Social History   Tobacco Use  . Smoking status: Never Smoker  . Smokeless tobacco: Never Used  Substance Use Topics  . Alcohol use: No    Comment: occasional   . Drug  use: No     Allergies   Patient has no known allergies.   Review of Systems Review of Systems  Constitutional: Negative for activity change, appetite change, chills, fatigue and fever.  HENT: Negative for congestion, ear pain, rhinorrhea, sinus pressure, sore throat and trouble swallowing.   Eyes: Negative for discharge and redness.  Respiratory: Negative for cough, chest tightness and shortness of breath.   Cardiovascular: Negative for chest pain.  Gastrointestinal: Positive for abdominal pain, diarrhea, nausea and vomiting.  Musculoskeletal: Negative for myalgias.  Skin: Negative for rash.  Neurological: Negative for dizziness, light-headedness and headaches.     Physical Exam Triage Vital Signs ED Triage Vitals  Enc Vitals Group     BP 02/06/21 1722 109/73     Pulse Rate 02/06/21 1722 (!) 111     Resp 02/06/21 1722 18     Temp 02/06/21 1722 99.3 F (37.4 C)     Temp Source 02/06/21 1722 Oral     SpO2 02/06/21 1722 97 %     Weight --      Height --      Head Circumference --      Peak Flow --      Pain Score 02/06/21 1723 4     Pain Loc --      Pain Edu? --      Excl. in GC? --  No data found.  Updated Vital Signs BP 109/73 (BP Location: Left Arm)   Pulse (!) 111   Temp 99.3 F (37.4 C) (Oral)   Resp 18   SpO2 97%   Visual Acuity Right Eye Distance:   Left Eye Distance:   Bilateral Distance:    Right Eye Near:   Left Eye Near:    Bilateral Near:     Physical Exam Vitals and nursing note reviewed.  Constitutional:      Appearance: He is well-developed.     Comments: No acute distress  HENT:     Head: Normocephalic and atraumatic.     Nose: Nose normal.  Eyes:     Conjunctiva/sclera: Conjunctivae normal.  Cardiovascular:     Rate and Rhythm: Normal rate and regular rhythm.  Pulmonary:     Effort: Pulmonary effort is normal. No respiratory distress.     Comments: Breathing comfortably at rest, CTABL, no wheezing, rales or other adventitious  sounds auscultated Abdominal:     General: There is no distension.     Comments: Soft, nondistended, nontender to light and to palpation throughout abdomen  Musculoskeletal:        General: Normal range of motion.     Cervical back: Neck supple.  Skin:    General: Skin is warm and dry.  Neurological:     Mental Status: He is alert and oriented to person, place, and time.      UC Treatments / Results  Labs (all labs ordered are listed, but only abnormal results are displayed) Labs Reviewed - No data to display  EKG   Radiology No results found.  Procedures Procedures (including critical care time)  Medications Ordered in UC Medications - No data to display  Initial Impression / Assessment and Plan / UC Course  I have reviewed the triage vital signs and the nursing notes.  Pertinent labs & imaging results that were available during my care of the patient were reviewed by me and considered in my medical decision making (see chart for details).     Viral gastroenteritis-negative peritoneal signs, recommending symptomatic and supportive care, push fluids and oral rehydration.  Monitor for gradual resolution.  Discussed strict return precautions. Patient verbalized understanding and is agreeable with plan.  Final Clinical Impressions(s) / UC Diagnoses   Final diagnoses:  Nausea vomiting and diarrhea     Discharge Instructions     Zofran dissolved in mouth as needed for nausea and vomiting Gradually increase fluids May use over-the-counter Pepcid to help with underlying indigestion Continue to monitor symptoms over the next few days, follow-up if not improving or worsening    ED Prescriptions    Medication Sig Dispense Auth. Provider   ondansetron (ZOFRAN ODT) 4 MG disintegrating tablet Take 1-2 tablets (4-8 mg total) by mouth every 8 (eight) hours as needed for nausea or vomiting. 20 tablet Brinnley Lacap, [place] C, PA-C     PDMP not reviewed this encounter.    Lew Dawes, [place] 02/07/21 [phone]

## 2021-07-24 IMAGING — DX DG LUMBAR SPINE COMPLETE 4+V
5 series · 5 of 5 positions shown · non-contrast
Comparison: None.

CLINICAL DATA: Low back pain, no known injury, initial encounter

EXAM:
LUMBAR SPINE - COMPLETE 4+ VIEW

[l-spine ap]
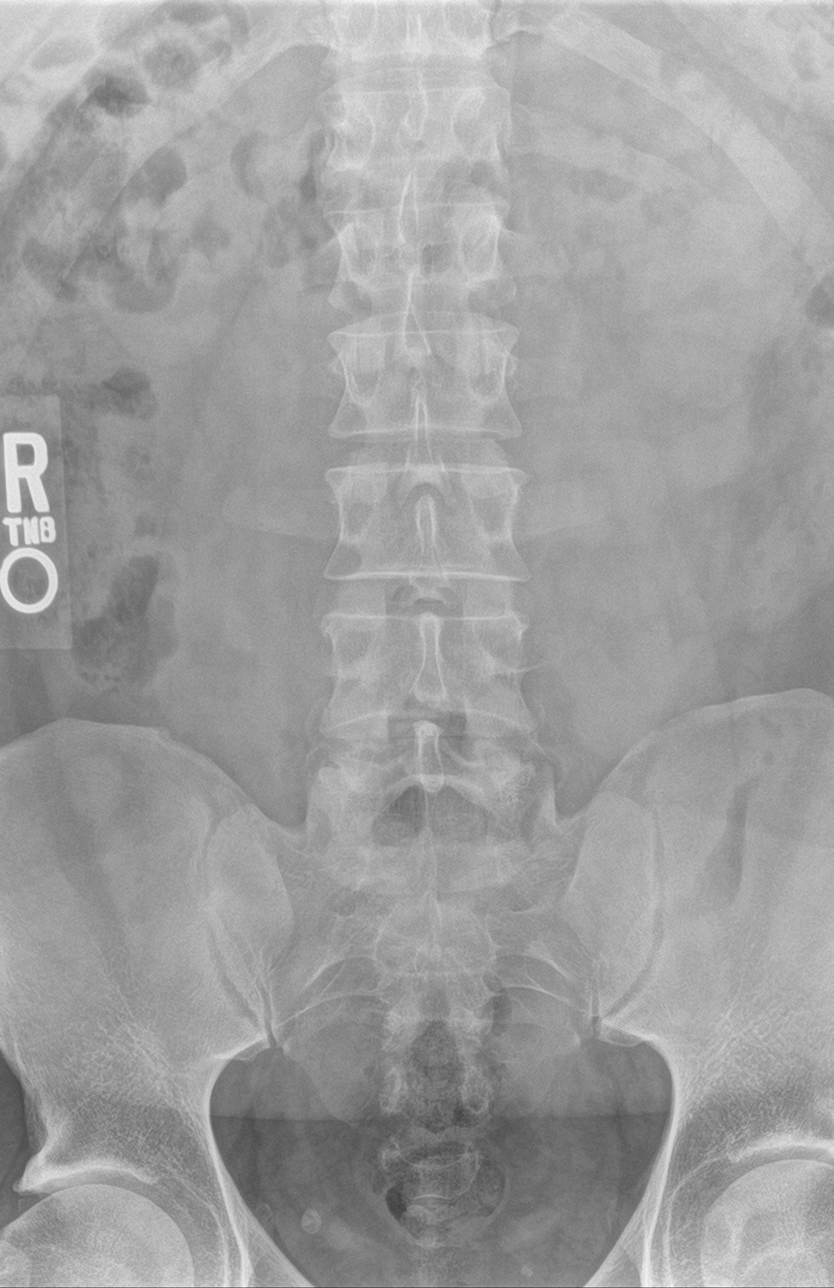

[l-spine obl (1 of 2)]
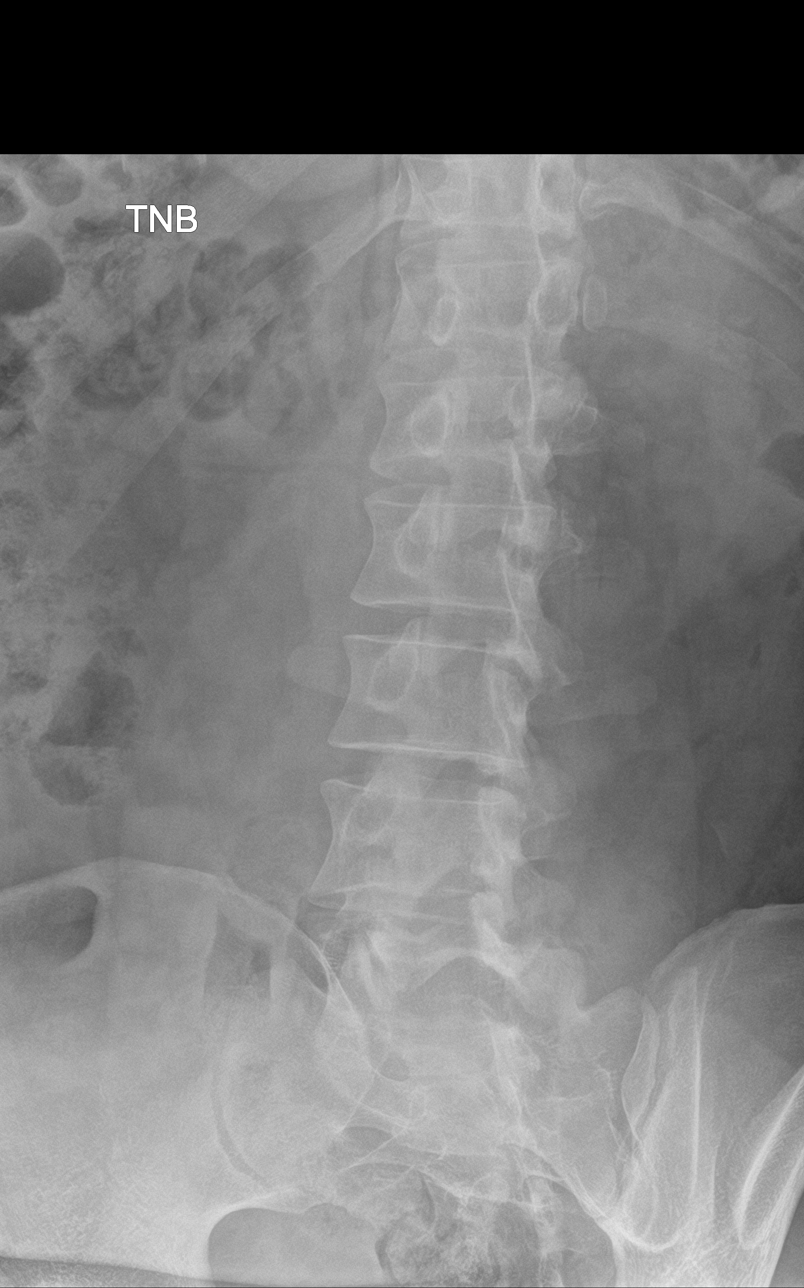

[l-spine obl (2 of 2)]
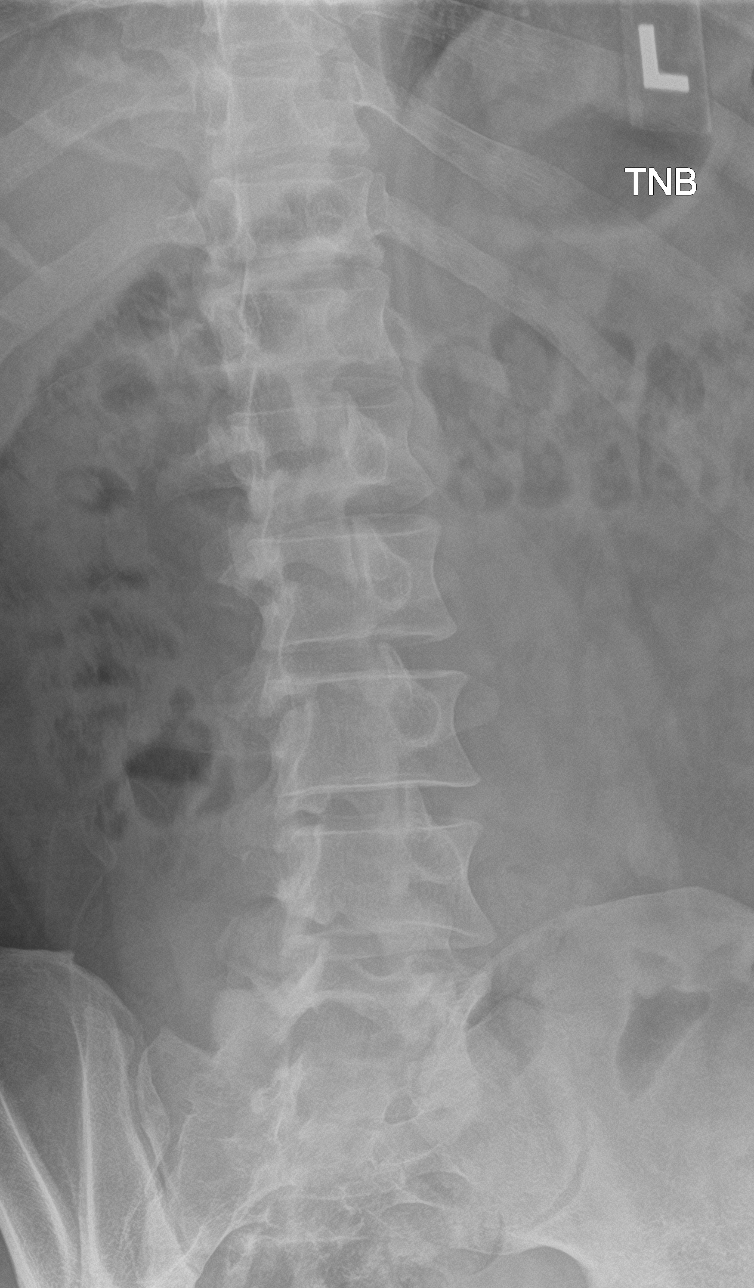

[l-spine lat]
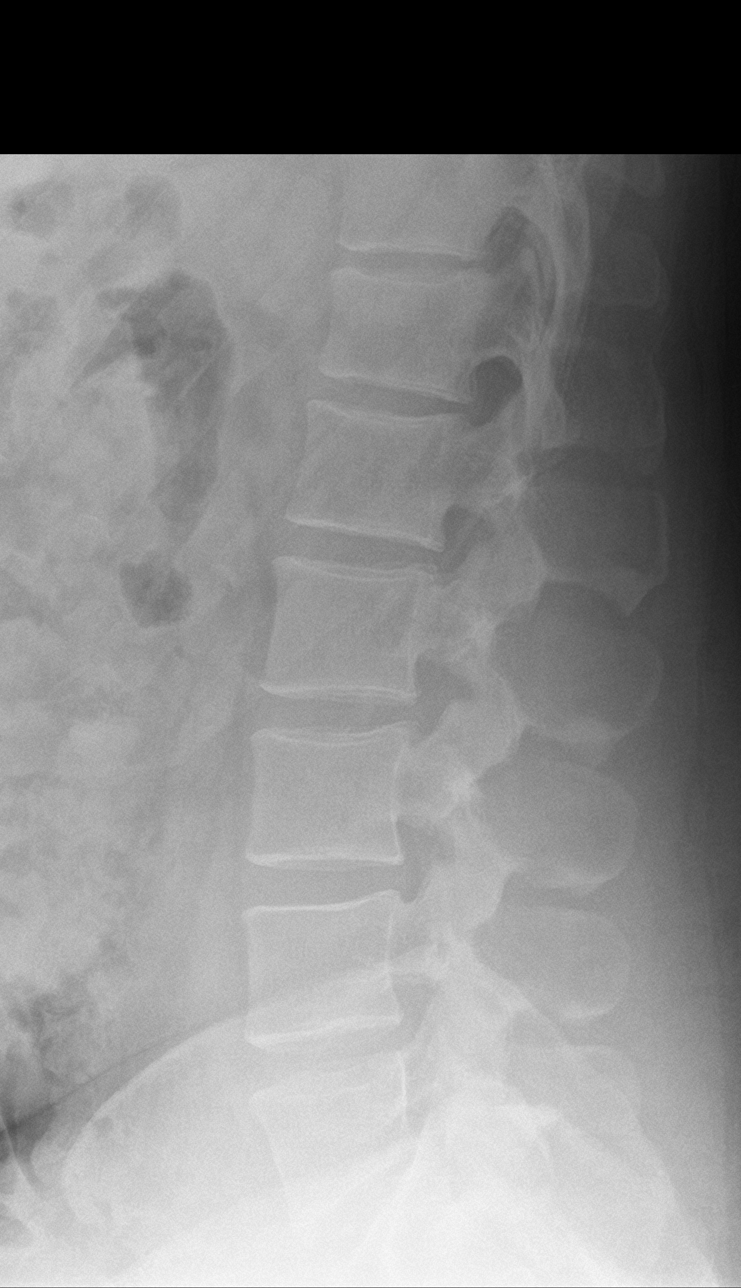

[l-spine spot]
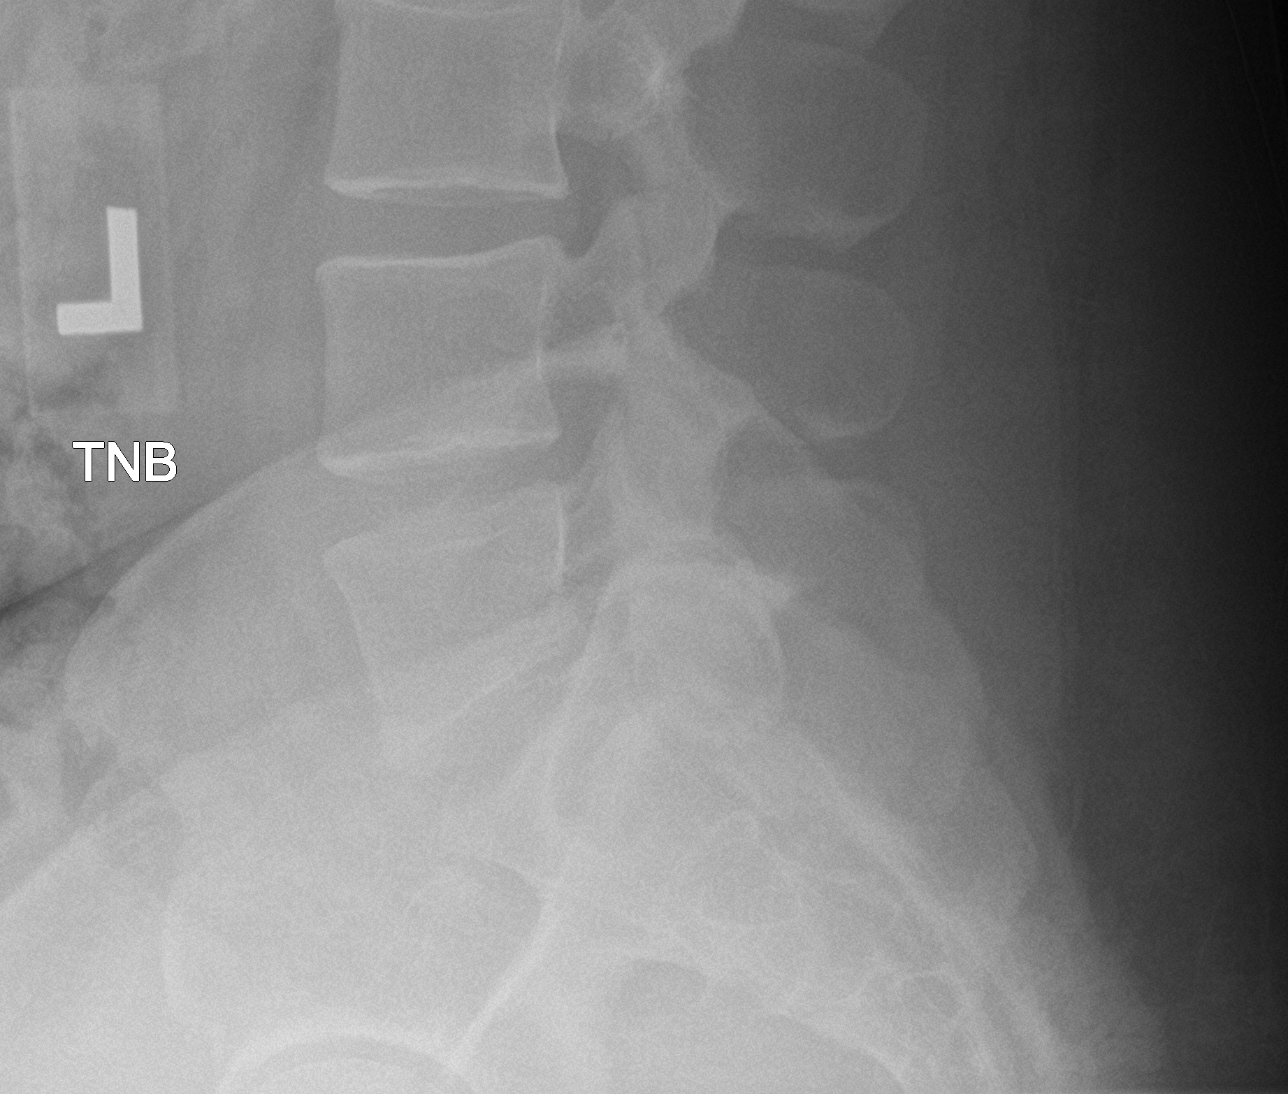

[5 of 5 positions shown; findings below may reference images not displayed]

FINDINGS: Five lumbar type vertebral bodies are well visualized. Vertebral
body height is well maintained. No pars defects are noted. No
anterolisthesis is seen. Mild disc space narrowing is noted at L4-5.
No other focal abnormality is noted.
IMPRESSION: Mild degenerative change without acute abnormality.

## 2022-08-06 ENCOUNTER — Ambulatory Visit: Payer: Self-pay
